# Patient Record
Sex: Female | Born: 1960
Health system: Southern US, Community
[De-identification: ages and names within clinical notes are randomized; demographics above are authoritative.]

## PROBLEM LIST (undated history)

## (undated) DIAGNOSIS — F419 Anxiety disorder, unspecified: Secondary | ICD-10-CM

## (undated) DIAGNOSIS — T8859XA Other complications of anesthesia, initial encounter: Secondary | ICD-10-CM

## (undated) DIAGNOSIS — M2400 Loose body in unspecified joint: Secondary | ICD-10-CM

## (undated) DIAGNOSIS — M1990 Unspecified osteoarthritis, unspecified site: Secondary | ICD-10-CM

## (undated) DIAGNOSIS — E785 Hyperlipidemia, unspecified: Secondary | ICD-10-CM

## (undated) DIAGNOSIS — N870 Mild cervical dysplasia: Secondary | ICD-10-CM

## (undated) DIAGNOSIS — T4145XA Adverse effect of unspecified anesthetic, initial encounter: Secondary | ICD-10-CM

## (undated) HISTORY — PX: AUGMENTATION MAMMAPLASTY: SUR837

## (undated) HISTORY — DX: Anxiety disorder, unspecified: F41.9

## (undated) HISTORY — DX: Mild cervical dysplasia: N87.0

## (undated) HISTORY — PX: LIPOSUCTION: SHX10

## (undated) HISTORY — DX: Hyperlipidemia, unspecified: E78.5

---

## 1985-12-13 DIAGNOSIS — N870 Mild cervical dysplasia: Secondary | ICD-10-CM

## 1985-12-13 HISTORY — DX: Mild cervical dysplasia: N87.0

## 1999-01-26 ENCOUNTER — Other Ambulatory Visit: Admission: RE | Admit: 1999-01-26 | Discharge: 1999-01-26 | Payer: Self-pay | Admitting: Obstetrics and Gynecology

## 1999-04-21 ENCOUNTER — Other Ambulatory Visit: Admission: RE | Admit: 1999-04-21 | Discharge: 1999-04-21 | Payer: Self-pay | Admitting: Obstetrics and Gynecology

## 2000-02-23 ENCOUNTER — Other Ambulatory Visit: Admission: RE | Admit: 2000-02-23 | Discharge: 2000-02-23 | Payer: Self-pay | Admitting: Obstetrics and Gynecology

## 2001-03-21 ENCOUNTER — Other Ambulatory Visit: Admission: RE | Admit: 2001-03-21 | Discharge: 2001-03-21 | Payer: Self-pay | Admitting: Obstetrics and Gynecology

## 2002-03-23 ENCOUNTER — Other Ambulatory Visit: Admission: RE | Admit: 2002-03-23 | Discharge: 2002-03-23 | Payer: Self-pay | Admitting: Obstetrics and Gynecology

## 2003-04-03 ENCOUNTER — Other Ambulatory Visit: Admission: RE | Admit: 2003-04-03 | Discharge: 2003-04-03 | Payer: Self-pay | Admitting: Obstetrics and Gynecology

## 2004-06-23 ENCOUNTER — Other Ambulatory Visit: Admission: RE | Admit: 2004-06-23 | Discharge: 2004-06-23 | Payer: Self-pay | Admitting: Obstetrics and Gynecology

## 2005-01-13 HISTORY — PX: EYE SURGERY: SHX253

## 2005-12-17 ENCOUNTER — Other Ambulatory Visit: Admission: RE | Admit: 2005-12-17 | Discharge: 2005-12-17 | Payer: Self-pay | Admitting: Obstetrics and Gynecology

## 2007-04-07 ENCOUNTER — Other Ambulatory Visit: Admission: RE | Admit: 2007-04-07 | Discharge: 2007-04-07 | Payer: Self-pay | Admitting: Obstetrics and Gynecology

## 2007-07-14 HISTORY — PX: OTHER SURGICAL HISTORY: SHX169

## 2007-07-25 ENCOUNTER — Ambulatory Visit (HOSPITAL_BASED_OUTPATIENT_CLINIC_OR_DEPARTMENT_OTHER): Admission: RE | Admit: 2007-07-25 | Discharge: 2007-07-25 | Payer: Self-pay | Admitting: Obstetrics and Gynecology

## 2008-04-19 ENCOUNTER — Other Ambulatory Visit: Admission: RE | Admit: 2008-04-19 | Discharge: 2008-04-19 | Payer: Self-pay | Admitting: Obstetrics and Gynecology

## 2009-12-13 HISTORY — PX: JOINT REPLACEMENT: SHX530

## 2011-04-27 NOTE — Op Note (Signed)
NAMEJOSY, Patricia [name]          ACCOUNT NO.:  [id number]   MEDICAL RECORD NO.:  [id number]          PATIENT TYPE:  AMB   LOCATION:  [facility]                         FACILITY:  [facility]   PHYSICIAN:  Cynthia P. Romine, M.D.DATE OF BIRTH:  [birth year]   DATE OF PROCEDURE:  07/25/2007  DATE OF DISCHARGE:                               OPERATIVE REPORT   PREOPERATIVE DIAGNOSES:  Menorrhagia.   POSTOPERATIVE DIAGNOSES:  Menorrhagia.   PROCEDURE:  Endometrial ablation, using the HydroThermAblation  technique.   SURGEON:  Cynthia P. Romine, M.D.   ANESTHESIA:  General by LMA.   ESTIMATED BLOOD LOSS:  Minimal.   COMPLICATIONS:  None.   DESCRIPTION OF PROCEDURE:  The patient was taken to the operating room  and after induction of general anesthesia, was placed in the dorsal  lithotomy position and prepped and draped in the usual fashion.  The  bladder was drained with a red rubber catheter.  Posterior weighted and  anterior Sims retractors were placed.  The cervix was grasped on its  anterior lip with a single-toothed tenaculum.  The uterus sounded to 8  cm.  The cervix was then dilated to a #25 Shawnie Pons.  The hysteroscope was  introduced.  Photographic documentation was taken of the cavity.  It was  a normal cavity.  The endometrium was nicely thinned from the Lupron the  patient had received preoperatively.  The tubal ostia were visualized.  The scope was withdrawn to just inside the endocervical canal.  Endometrial ablation was then carried out in the usual fashion,  according to the manufacturer's instructions.  Upon completion of the  procedure, photographic documentation was taken.  The scope was  withdrawn, the instruments removed from the vagina and the procedure was  terminated.  There were no complications.  Patient went in satisfactory  condition to postanesthesia recovery.      Cynthia P. Romine, M.D.  Electronically Signed     CPR/MEDQ  D:  07/25/2007  T:  07/26/2007   Job:  [id number]

## 2011-09-27 LAB — CBC
Hemoglobin: 13.9
MCHC: 34.4
MCV: 87.7
RDW: 13.2

## 2011-09-27 LAB — POCT HEMOGLOBIN-HEMACUE: Operator id: 114531

## 2013-06-26 ENCOUNTER — Encounter: Payer: Self-pay | Admitting: Obstetrics and Gynecology

## 2013-06-29 ENCOUNTER — Encounter: Payer: Self-pay | Admitting: Obstetrics and Gynecology

## 2013-06-29 ENCOUNTER — Ambulatory Visit (INDEPENDENT_AMBULATORY_CARE_PROVIDER_SITE_OTHER): Payer: BC Managed Care – PPO | Admitting: Obstetrics and Gynecology

## 2013-06-29 VITALS — BP 128/78 | HR 84 | Resp 18 | Ht 65.0 in | Wt 150.0 lb

## 2013-06-29 DIAGNOSIS — Z01419 Encounter for gynecological examination (general) (routine) without abnormal findings: Secondary | ICD-10-CM

## 2013-06-29 MED ORDER — PROGESTERONE MICRONIZED 100 MG PO CAPS: 100.0000 mg | ORAL_CAPSULE | Freq: Every day | ORAL | Status: DC

## 2013-06-29 MED ORDER — PAROXETINE HCL 10 MG PO TABS: 10.0000 mg | ORAL_TABLET | ORAL | Status: DC

## 2013-06-29 MED ORDER — ESTRADIOL 0.05 MG/24HR TD PTTW: 1.0000 | MEDICATED_PATCH | TRANSDERMAL | Status: DC

## 2013-06-29 NOTE — Progress Notes (Signed)
52 y.o.   Married    Caucasian   female   G2P2002   here for annual exam.  Saw me in Jan 2014 for [facility] sx's and started on Minivelle and Prometrium, called March 2014 stating she felt great, sleep better, fewer hot flashes.  Pt thought they caused wt gain so she stopped them for 4 weeks, and felt like she was going to "die"  More hot flashes, couldn't sleep so went back on. No vag bleeding.    No LMP recorded. Patient has had an ablation.          Sexually active: yes  The current method of family planning is post menopausal status.    Exercising: walking 4-5 days a week Last mammogram:  02/23/2013 benign Last pap smear:05/18/2010 neg History of abnormal pap:1987 CIN 1 Cryo  Smoking: no Alcohol: 7-10 glasses of wine a week Last colonoscopy:never pt declines Last Bone Density:  never Last tetanus shot:2013 Last cholesterol check: 01/2013 slightly elevated  Hgb:  pcp              Urine: pcp   Family History  Problem Relation Age of Onset  . Hypertension Mother 74  . Alzheimer's disease Mother   . Lymphoma Father   . Cancer Father     brain tumor    There are no active problems to display for this patient.   Past Medical History  Diagnosis Date  . Depression   . Anxiety   . Dysplasia of cervix, low grade (CIN 1) 1987    cryo    Past Surgical History  Procedure Laterality Date  . Liposuction    . Augmentation mammaplasty      2009 ruptured silicone implants, replaced with saline  . Joint replacement  12/2009    toe joint    Allergies: Review of patient's allergies indicates no known allergies.  Current Outpatient Prescriptions  Medication Sig Dispense Refill  . B Complex-C (B-COMPLEX WITH VITAMIN C) tablet Take 1 tablet by mouth daily.      . Calcium Carbonate-Vitamin D (CALCIUM + D PO) Take 1 tablet by mouth daily.      . cetirizine (ZYRTEC) 10 MG tablet Take 10 mg by mouth daily.      . fluticasone (FLONASE) 50 MCG/ACT nasal spray Place 2 sprays into the nose daily as  needed for rhinitis.      Marland Kitchen MELATONIN PO Take by mouth as needed.       Marland Kitchen MINIVELLE 0.05 MG/24HR 2 (two) times a week.       . Misc Natural Products (OSTEO BI-FLEX ADV DOUBLE ST PO) Take by mouth daily.      . Multiple Vitamin (MULTIVITAMIN) tablet Take 1 tablet by mouth daily.      Marland Kitchen PARoxetine (PAXIL) 10 MG tablet Take 10 mg by mouth every morning.      . progesterone (PROMETRIUM) 100 MG capsule daily.        No current facility-administered medications for this visit.    ROS: Pertinent items are noted in HPI.  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, theatre/television/film  Exam:    BP 128/78  Pulse 84  Resp 18  Ht 5\' 5"  (1.651 m)  Wt 150 lb (68.04 kg)  BMI 24.96 kg/m2  Ht stable, wt up 4 pounds since last year Wt Readings from Last 3 Encounters:  06/29/13 150 lb (68.04 kg)     Ht Readings from Last 3 Encounters:  06/29/13 5\' 5"  (1.651 m)  General appearance: alert, cooperative and appears stated age Head: Normocephalic, without obvious abnormality, atraumatic Neck: no adenopathy, supple, symmetrical, trachea midline and thyroid not enlarged, symmetric, no tenderness/mass/nodules Lungs: clear to auscultation bilaterally Breasts: Inspection negative, No nipple retraction or dimpling, No nipple discharge or bleeding, No axillary or supraclavicular adenopathy, Normal to palpation without dominant masses Heart: regular rate and rhythm Abdomen: soft, non-tender; bowel sounds normal; no masses,  no organomegaly Extremities: extremities normal, atraumatic, no cyanosis or edema Skin: Skin color, texture, turgor normal. No rashes or lesions Lymph nodes: Cervical, supraclavicular, and axillary nodes normal. No abnormal inguinal nodes palpated Neurologic: Grossly normal   Pelvic: External genitalia:  no lesions              Urethra:  normal appearing urethra with no masses, tenderness or lesions              Bartholins and Skenes: normal                 Vagina: normal appearing vagina with  normal color and discharge, no lesions              Cervix: normal appearance              Pap taken: yes        Bimanual Exam:  Uterus:  uterus is normal size, shape, consistency and nontender, mobile, AF                                      Adnexa: normal adnexa in size, nontender and no masses                                      Rectovaginal: Confirms                                      Anus:  normal sphincter tone, no lesions  A: normal menopausal exam, on HRT     S/p HTA     Anxiety, on tx     P: mammogram pap smear counseled on breast self exam, mammography screening, adequate intake of calcium and vitamin D, diet and exercise return annually or prn   RF Minivell, P4, and Paxil  An After Visit Summary was printed and given to the patient.

## 2013-06-29 NOTE — Patient Instructions (Addendum)

## 2013-07-03 LAB — IPS PAP TEST WITH HPV

## 2013-07-31 ENCOUNTER — Other Ambulatory Visit: Payer: Self-pay | Admitting: Obstetrics and Gynecology

## 2013-08-01 NOTE — Telephone Encounter (Signed)
06/29/13 #90/3rf's was sent through to pharmacy- Denied

## 2014-03-27 ENCOUNTER — Encounter: Payer: Self-pay | Admitting: Obstetrics and Gynecology

## 2014-06-26 ENCOUNTER — Other Ambulatory Visit: Payer: Self-pay | Admitting: Obstetrics and Gynecology

## 2014-06-26 MED ORDER — ESTRADIOL 0.05 MG/24HR TD PTTW: 1.0000 | MEDICATED_PATCH | TRANSDERMAL | Status: DC

## 2014-06-26 NOTE — Telephone Encounter (Signed)
estradiol (MINIVELLE) 0.05 MG/24HR  Place 1 patch (0.05 mg total) onto the skin 2 (two) times a week., Starting 06/29/2013, Until Discontinued, Normal, Last Dose: Not Recorded  Refills: 3 ordered [id number] - [place], [place] - [address] [facility] [address]  Patient asking for refill has aex scheduled for 07/26/14

## 2014-06-26 NOTE — Telephone Encounter (Addendum)
LM on patient's vm that rx has been sent. 

## 2014-06-26 NOTE — Telephone Encounter (Signed)
Last refilled/ AEX: 06/29/13 #24/3 Refills AEX Scheduled: 07/26/14 with Dr. Edward JollySilva Last Mammogram:  02/23/13 Bi-rads 1  Please Advise.  (Chart in your incoming basket)  (Routed to Dr. Hyacinth MeekerMiller given Dr. Edward JollySilva out of office this week)

## 2014-07-05 ENCOUNTER — Ambulatory Visit: Payer: BC Managed Care – PPO | Admitting: Obstetrics and Gynecology

## 2014-07-08 ENCOUNTER — Other Ambulatory Visit: Payer: Self-pay

## 2014-07-08 MED ORDER — PAROXETINE HCL 10 MG PO TABS: 10.0000 mg | ORAL_TABLET | ORAL | Status: DC

## 2014-07-08 NOTE — Telephone Encounter (Signed)
Last AEX: 06/29/13 Last refill:06/29/13 #90, 3rf Current AEX:07/26/14  Please advise

## 2014-07-17 ENCOUNTER — Ambulatory Visit: Payer: BC Managed Care – PPO | Admitting: Obstetrics and Gynecology

## 2014-07-26 ENCOUNTER — Ambulatory Visit (INDEPENDENT_AMBULATORY_CARE_PROVIDER_SITE_OTHER): Payer: BC Managed Care – PPO | Admitting: Obstetrics and Gynecology

## 2014-07-26 ENCOUNTER — Encounter: Payer: Self-pay | Admitting: Obstetrics and Gynecology

## 2014-07-26 VITALS — BP 122/84 | HR 60 | Resp 18 | Ht 65.0 in | Wt 160.8 lb

## 2014-07-26 DIAGNOSIS — Z1211 Encounter for screening for malignant neoplasm of colon: Secondary | ICD-10-CM

## 2014-07-26 DIAGNOSIS — Z0000 Encounter for general adult medical examination without abnormal findings: Secondary | ICD-10-CM

## 2014-07-26 DIAGNOSIS — Z01419 Encounter for gynecological examination (general) (routine) without abnormal findings: Secondary | ICD-10-CM

## 2014-07-26 MED ORDER — ESTRADIOL 0.05 MG/24HR TD PTTW: 1.0000 | MEDICATED_PATCH | TRANSDERMAL | Status: DC

## 2014-07-26 MED ORDER — PROGESTERONE MICRONIZED 100 MG PO CAPS: 100.0000 mg | ORAL_CAPSULE | Freq: Every day | ORAL | Status: DC

## 2014-07-26 MED ORDER — PAROXETINE HCL 10 MG PO TABS: 10.0000 mg | ORAL_TABLET | ORAL | Status: DC

## 2014-07-26 NOTE — Patient Instructions (Signed)

## 2014-07-26 NOTE — Progress Notes (Signed)
Patient ID: Patricia [name], female   DOB: 08-18-1961, 53 y.o.   MRN: [id number] GYNECOLOGY VISIT  PCP:   Juluis Rainier, MD  Referring provider:   HPI: 53 y.o.   Married  Caucasian  female   G2P2002 with No LMP recorded. Patient has had an ablation.   here for   AEX. Patient is on HRT.   Stopped in January or February and just restarted in July due to hot flashes and difficulty sleeping.  Feeling much better.   Has gained weight and not changed anything except menopause.  Knee issues so cannot do vigorous exercise.  Exercises 3 - 4 times a week.  Has seen nutrition.  Thyroid function normal.  On Paxil for anxiety.  Difficulty stopping.   2 children living in [place].   Hgb:   PCP Urine:  Neg  GYNECOLOGIC HISTORY: No LMP recorded. Patient has had an ablation. Sexually active:  yes Partner preference: female Contraception:  Ablation  Menopausal hormone therapy:  DES exposure:   no Blood transfusions:  no   Sexually transmitted diseases:  no GYN procedures and prior surgeries:  Breast Augmentation, Ablation Last mammogram:  02/23/13- BI-RADS 2. [facility].         Last pap and high risk HPV testing: 06-29-13 wnl:neg HR HPV   History of abnormal pap smear:  1987 CIN I with cryotherapy   OB History   Grav Para Term Preterm Abortions TAB SAB Ect Mult Living   2 2 2       2        LIFESTYLE: Exercise:    Walking 3x/week            OTHER HEALTH MAINTENANCE: Tetanus/TDap:  2013 HPV:                 n/a Influenza:         09/2013   Bone density:   never Colonoscopy:   Never.  Declines.  Cholesterol check:  05/2014 elevated--on meds  Family History  Problem Relation Age of Onset  . Hypertension Mother 29  . Alzheimer's disease Mother   . Lymphoma Father   . Cancer Father     brain tumor    There are no active problems to display for this patient.  Past Medical History  Diagnosis Date  . Depression   . Anxiety   . Dysplasia of cervix, low grade (CIN 1) 1987     cryo  . Hyperlipemia     Past Surgical History  Procedure Laterality Date  . Liposuction    . Augmentation mammaplasty      2009 ruptured silicone implants, replaced with saline  . Joint replacement  12/2009    toe joint    ALLERGIES: Review of patient's allergies indicates no known allergies.  Current Outpatient Prescriptions  Medication Sig Dispense Refill  . B Complex-C (B-COMPLEX WITH VITAMIN C) tablet Take 1 tablet by mouth daily.      . Calcium Carbonate-Vitamin D (CALCIUM + D PO) Take 1 tablet by mouth daily.      . cetirizine (ZYRTEC) 10 MG tablet Take 10 mg by mouth daily.      Marland Kitchen estradiol (MINIVELLE) 0.05 MG/24HR patch Place 1 patch (0.05 mg total) onto the skin 2 (two) times a week.  8 patch  1  . fluticasone (FLONASE) 50 MCG/ACT nasal spray Place 2 sprays into the nose daily as needed for rhinitis.      Marland Kitchen PARoxetine (PAXIL) 10 MG tablet Take 1 tablet (  10 mg total) by mouth every morning.  30 tablet  0  . progesterone (PROMETRIUM) 100 MG capsule Take 1 capsule (100 mg total) by mouth daily.  90 capsule  3  . simvastatin (ZOCOR) 40 MG tablet Take 40 mg by mouth daily.       No current facility-administered medications for this visit.     ROS:  Pertinent items are noted in HPI.  History   Social History  . Marital Status: Married    Spouse Name: N/A    Number of Children: N/A  . Years of Education: N/A   Occupational History  . Not on file.   Social History Main Topics  . Smoking status: Never Smoker   . Smokeless tobacco: Never Used  . Alcohol Use: 5.4 oz/week    9 Glasses of wine per week     Comment: 7-9 glasses of wine a week  . Drug Use: No  . Sexual Activity: Yes    Partners: Male    Birth Control/ Protection: Post-menopausal   Other Topics Concern  . Not on file   Social History Narrative  . No narrative on file    PHYSICAL EXAMINATION:    BP 122/84  Pulse 60  Resp 18  Ht 5\' 5"  (1.651 m)  Wt 160 lb 12.8 oz (72.938 kg)  BMI 26.76  kg/m2   Wt Readings from Last 3 Encounters:  07/26/14 160 lb 12.8 oz (72.938 kg)  06/29/13 150 lb (68.04 kg)     Ht Readings from Last 3 Encounters:  07/26/14 5\' 5"  (1.651 m)  06/29/13 5\' 5"  (1.651 m)    General appearance: alert, cooperative and appears stated age Head: Normocephalic, without obvious abnormality, atraumatic Neck: no adenopathy, supple, symmetrical, trachea midline and thyroid not enlarged, symmetric, no tenderness/mass/nodules Lungs: clear to auscultation bilaterally Breasts: bilateral implants, No nipple retraction or dimpling, No nipple discharge or bleeding, No axillary or supraclavicular adenopathy, Normal to palpation without dominant masses Heart: regular rate and rhythm Abdomen: soft, non-tender; no masses,  no organomegaly Extremities: extremities normal, atraumatic, no cyanosis or edema Skin: Skin color, texture, turgor normal. No rashes or lesions Lymph nodes: Cervical, supraclavicular, and axillary nodes normal. No abnormal inguinal nodes palpated Neurologic: Grossly normal  Pelvic: External genitalia:  no lesions              Urethra:  normal appearing urethra with no masses, tenderness or lesions              Bartholins and Skenes: normal                 Vagina: normal appearing vagina with normal color and discharge, no lesions              Cervix: normal appearance              Pap and high risk HPV testing done: No.        Bimanual Exam:  Uterus:  uterus is normal size, shape, consistency and nontender                                      Adnexa: normal adnexa in size, nontender and no masses                                      Rectovaginal:  Yes.                                        Confirms above.                                      Anus:  normal sphincter tone, no lesions  ASSESSMENT  Normal gynecologic exam. HRT patient.  Bilateral breast implants.   PLAN  Mammogram recommended yearly starting at age 88.  Patient will schedule at  [facility]. Pap smear and high risk HPV testing as above. Counseled on self breast exam, Calcium and vitamin D intake, exercise. See lab orders: Yes.   IFOB. Refill of Minivelle 0.05 mg and Prometrium for one month until gets mammogram done.  Return annually or prn   An After Visit Summary was printed and given to the patient.

## 2014-07-30 ENCOUNTER — Encounter: Payer: Self-pay | Admitting: Certified Nurse Midwife

## 2014-07-30 ENCOUNTER — Ambulatory Visit (INDEPENDENT_AMBULATORY_CARE_PROVIDER_SITE_OTHER): Payer: BC Managed Care – PPO | Admitting: Certified Nurse Midwife

## 2014-07-30 VITALS — BP 122/80 | HR 68 | Resp 16 | Ht 65.0 in | Wt 162.0 lb

## 2014-07-30 DIAGNOSIS — B373 Candidiasis of vulva and vagina: Secondary | ICD-10-CM

## 2014-07-30 DIAGNOSIS — B3731 Acute candidiasis of vulva and vagina: Secondary | ICD-10-CM

## 2014-07-30 MED ORDER — NYSTATIN-TRIAMCINOLONE 100000-0.1 UNIT/GM-% EX CREA: 1.0000 "application " | TOPICAL_CREAM | Freq: Two times a day (BID) | CUTANEOUS | Status: DC

## 2014-07-30 MED ORDER — FLUCONAZOLE 150 MG PO TABS: ORAL_TABLET | ORAL | Status: DC

## 2014-07-30 NOTE — Progress Notes (Signed)
53 y.o.married g2p2 here with complaint of vaginal symptoms of itching, burning, and increase discharge. Describes discharge as white thick, no odor.. Onset of symptoms 4 days ago. Denies new personal products or vaginal dryness. [place] with staying wet swim suit prolonged times. No STD concerns. Urinary symptoms none .   O:Healthy female WDWN Affect: normal, orientation x 3  Exam: Abdomen: soft. Lymph node: no enlargement or tenderness Pelvic exam: External genital: increase pink with exudate, no scaling  Wet prep taken BUS: negative Vagina: white thick cheese appearance discharge noted. Ph: 4.0   ,Wet prep taken Cervix: normal, non tender Uterus: normal, non tender Adnexa:normal, non tender, no masses or fullness noted   Wet Prep results:positive for yeast vaginal and on vulva   A: Yeast vaginitis and vulvitis Vaginal dryness   P:Discussed findings of yeast vaginitis and vulvutis and etiology. Discussed Aveeno or baking soda sitz bath for comfort. Avoid moist clothes or pads for extended period of time. If working out in gym clothes or swim suits for long periods of time change underwear or bottoms of swimsuit if possible. Olive Oil/Coconut oil use for skin protection prior to activity can be used to external skin. Rx: Diflucan see order Rx Mycolog cream see order  Rv prn

## 2014-07-30 NOTE — Patient Instructions (Signed)

## 2014-07-31 NOTE — Progress Notes (Signed)
Reviewed personally.  M. Suzanne Burhan Barham, MD.  

## 2014-08-07 LAB — FECAL OCCULT BLOOD, IMMUNOCHEMICAL: IMMUNOLOGICAL FECAL OCCULT BLOOD TEST: NEGATIVE

## 2014-08-07 NOTE — Addendum Note (Signed)
Addended by: Luisa Dago on: 08/07/2014 09:54 AM   Modules accepted: Orders

## 2014-08-08 ENCOUNTER — Encounter: Payer: Self-pay | Admitting: Obstetrics and Gynecology

## 2014-08-19 ENCOUNTER — Other Ambulatory Visit: Payer: Self-pay | Admitting: Obstetrics and Gynecology

## 2014-08-20 NOTE — Telephone Encounter (Signed)
Last AEX: 07/26/14 Last refill:  Vivelle-Dot-07/26/14 #8 patches X 0, Progesterone-07/26/14 #30 X 0 Current AEX:09/12/15 Last MMG: 08/02/14  Bi-Rads Benign  Please advise

## 2014-09-15 ENCOUNTER — Other Ambulatory Visit: Payer: Self-pay | Admitting: Obstetrics & Gynecology

## 2014-09-16 NOTE — Telephone Encounter (Signed)
Last AEX: 07/26/14 Last refill:08/21/14 #30 X 0 Current AEX:09/12/15 (Dr. Edward JollySilva) Last MMG: 08/02/14 Bi-Rads Benign  Please advise

## 2014-10-14 ENCOUNTER — Encounter: Payer: Self-pay | Admitting: Certified Nurse Midwife

## 2014-12-12 HISTORY — PX: KNEE SURGERY: SHX244

## 2014-12-18 ENCOUNTER — Telehealth: Payer: Self-pay | Admitting: Obstetrics and Gynecology

## 2014-12-18 MED ORDER — FLUCONAZOLE 150 MG PO TABS: 150.0000 mg | ORAL_TABLET | Freq: Once | ORAL | Status: DC

## 2014-12-18 NOTE — Telephone Encounter (Signed)
I will agree to treat the patient with Diflucan 150 mg. Dispense 2 tabs. No refills.  Please send in to her pharmacy.   Office visit if symptoms persist.

## 2014-12-18 NOTE — Telephone Encounter (Signed)
Patient calling requesting an RX to treat a "yeast infection." She recently had knee surgery and is not able to come in.   Pharmacy on file is correct.

## 2014-12-18 NOTE — Telephone Encounter (Signed)
Spoke with patient. Advised rx for Diflucan 150mg  #2 0RF [facility] Patient is agreeable and will call if symptoms persist.  Routing to provider for final review. Patient agreeable to disposition. Will close encounter

## 2014-12-18 NOTE — Telephone Encounter (Signed)
Spoke with patient. Patient states that she had knee surgery on 12/31. Is currently on antibiotics. Yesterday began to have vaginal itching, burning, and thick white discharge. "I think it is just from the antibiotics and I had to change soaps from the risk of infection after surgery. I am not able to get to the office so I was hoping she could call something in for me." Advised patient will need to speak with Dr.Silva and return call with further recommendations and instructions. Patient is currently using [facility] on file.

## 2015-09-12 ENCOUNTER — Ambulatory Visit: Payer: BC Managed Care – PPO | Admitting: Obstetrics and Gynecology

## 2015-09-26 ENCOUNTER — Encounter: Payer: Self-pay | Admitting: Certified Nurse Midwife

## 2015-09-26 ENCOUNTER — Ambulatory Visit (INDEPENDENT_AMBULATORY_CARE_PROVIDER_SITE_OTHER): Payer: BLUE CROSS/BLUE SHIELD | Admitting: Certified Nurse Midwife

## 2015-09-26 VITALS — BP 122/84 | HR 72 | Resp 20 | Ht 65.25 in | Wt 159.0 lb

## 2015-09-26 DIAGNOSIS — N952 Postmenopausal atrophic vaginitis: Secondary | ICD-10-CM

## 2015-09-26 DIAGNOSIS — Z01419 Encounter for gynecological examination (general) (routine) without abnormal findings: Secondary | ICD-10-CM | POA: Diagnosis not present

## 2015-09-26 DIAGNOSIS — Z124 Encounter for screening for malignant neoplasm of cervix: Secondary | ICD-10-CM | POA: Diagnosis not present

## 2015-09-26 NOTE — Patient Instructions (Signed)
EXERCISE AND DIET:  We recommended that you start or continue a regular exercise program for good health. Regular exercise means any activity that makes your heart beat faster and makes you sweat.  We recommend exercising at least 30 minutes per day at least 3 days a week, preferably 4 or 5.  We also recommend a diet low in fat and sugar.  Inactivity, poor dietary choices and obesity can cause diabetes, heart attack, stroke, and kidney damage, among others.    ALCOHOL AND SMOKING:  Women should limit their alcohol intake to no more than 7 drinks/beers/glasses of wine (combined, not each!) per week. Moderation of alcohol intake to this level decreases your risk of breast cancer and liver damage. And of course, no recreational drugs are part of a healthy lifestyle.  And absolutely no smoking or even second hand smoke. Most people know smoking can cause heart and lung diseases, but did you know it also contributes to weakening of your bones? Aging of your skin?  Yellowing of your teeth and nails?  CALCIUM AND VITAMIN D:  Adequate intake of calcium and Vitamin D are recommended.  The recommendations for exact amounts of these supplements seem to change often, but generally speaking 600 mg of calcium (either carbonate or citrate) and 800 units of Vitamin D per day seems prudent. Certain women may benefit from higher intake of Vitamin D.  If you are among these women, your doctor will have told you during your visit.    PAP SMEARS:  Pap smears, to check for cervical cancer or precancers,  have traditionally been done yearly, although recent scientific advances have shown that most women can have pap smears less often.  However, every woman still should have a physical exam from her gynecologist every year. It will include a breast check, inspection of the vulva and vagina to check for abnormal growths or skin changes, a visual exam of the cervix, and then an exam to evaluate the size and shape of the uterus and  ovaries.  And after 55 years of age, a rectal exam is indicated to check for rectal cancers. We will also provide age appropriate advice regarding health maintenance, like when you should have certain vaccines, screening for sexually transmitted diseases, bone density testing, colonoscopy, mammograms, etc.   MAMMOGRAMS:  All women over 40 years old should have a yearly mammogram. Many facilities now offer a "3D" mammogram, which may cost around $50 extra out of pocket. If possible,  we recommend you accept the option to have the 3D mammogram performed.  It both reduces the number of women who will be called back for extra views which then turn out to be normal, and it is better than the routine mammogram at detecting truly abnormal areas.    COLONOSCOPY:  Colonoscopy to screen for colon cancer is recommended for all women at age 50.  We know, you hate the idea of the prep.  We agree, BUT, having colon cancer and not knowing it is worse!!  Colon cancer so often starts as a polyp that can be seen and removed at colonscopy, which can quite literally save your life!  And if your first colonoscopy is normal and you have no family history of colon cancer, most women don't have to have it again for 10 years.  Once every ten years, you can do something that may end up saving your life, right?  We will be happy to help you get it scheduled when you are ready.    Be sure to check your insurance coverage so you understand how much it will cost.  It may be covered as a preventative service at no cost, but you should check your particular policy.     Atrophic Vaginitis Atrophic vaginitis is a condition in which the tissues that line the vagina become dry and thin. This condition is most common in women who have stopped having regular menstrual periods (menopause). This usually starts when a woman is 45-55 years old. Estrogen helps to keep the vagina moist. It stimulates the vagina to produce a clear fluid that lubricates  the vagina for sexual intercourse. This fluid also protects the vagina from infection. Lack of estrogen can cause the lining of the vagina to get thinner and dryer. The vagina may also shrink in size. It may become less elastic. Atrophic vaginitis tends to get worse over time as a woman's estrogen level drops. CAUSES This condition is caused by the normal drop in estrogen that happens around the time of menopause. RISK FACTORS Certain conditions or situations may lower a woman's estrogen level, which increases her risk of atrophic vaginitis. These include:  Taking medicine that blocks estrogen.  Having ovaries removed surgically.  Being treated for cancer with X-ray treatment (radiation) or medicines (chemotherapy).  Exercising very hard and often.  Having an eating disorder (anorexia).  Giving birth or breastfeeding.  Being over the age of 50.  Smoking. SYMPTOMS Symptoms of this condition include:  Pain, soreness, or bleeding during sexual intercourse (dyspareunia).  Vaginal burning, irritation, or itching.  Pain or bleeding during a vaginal examination using a speculum (pelvic exam).  Loss of interest in sexual activity.  Having burning pain when passing urine.  Vaginal discharge that is brown or yellow. In some cases, there are no symptoms. DIAGNOSIS This condition is diagnosed with a medical history and physical exam. This will include a pelvic exam that checks whether the inside of your vagina appears pale, thin, or dry. Rarely, you may also have other tests, including:  A urine test.  A test that checks the acid balance in your vaginal fluid (acid balance test). TREATMENT Treatment for this condition may depend on the severity of your symptoms. Treatment may include:  Using an over-the-counter vaginal lubricant before you have sexual intercourse.  Using a long-acting vaginal moisturizer.  Using low-dose vaginal estrogen for moderate to severe symptoms that do  not respond to other treatments. Options include creams, tablets, and inserts (vaginal rings). Before using vaginal estrogen, tell your health care provider if you have a history of:  Breast cancer.  Endometrial cancer.  Blood clots.  Taking medicines. You may be able to take a daily pill for dyspareunia. Discuss all of the risks of this medicine with your health care provider. It is usually not recommended for women who have a family history or personal history of breast cancer. If your symptoms are very mild and you are not sexually active, you may not need treatment. HOME CARE INSTRUCTIONS  Take medicines only as directed by your health care provider. Do not use herbal or alternative medicines unless your health care provider says that you can.  Use over-the-counter creams, lubricants, or moisturizers for dryness only as directed by your health care provider.  If your atrophic vaginitis is caused by menopause, discuss all of your menopausal symptoms and treatment options with your health care provider.  Do not douche.  Do not use products that can make your vagina dry. These include:  Scented feminine sprays.    Scented tampons.  Scented soaps.  If it hurts to have sex, talk with your sexual partner. SEEK MEDICAL CARE IF:  Your discharge looks different than normal.  Your vagina has an unusual smell.  You have new symptoms.  Your symptoms do not improve with treatment.  Your symptoms get worse.   This information is not intended to replace advice given to you by your health care provider. Make sure you discuss any questions you have with your health care provider.   Document Released: 04/15/2015 Document Reviewed: 04/15/2015 Elsevier Interactive Patient Education 2016 [employer]  

## 2015-09-26 NOTE — Progress Notes (Signed)
54 y.o. [id number] Married  Caucasian Fe here for annual exam.  Menopausal no HRT, denies vaginal bleeding. Occasional vaginal dryness.  No more problems with vaginal yeast infections now. Aware of flu vaccine, does not take. Sees PCP for aex and labs and medication management for anxiety and depression. Last cholesterol still high per PCP, working with diet and exercise.  Busy with work.  No other health issues today.   Patient's last menstrual period was 12/13/2006.          Sexually active: Yes.    The current method of family planning is post menopausal status.    Exercising: Yes.    yoga,sit ups Smoker:  no  Health Maintenance: Pap: 06-29-13 neg HPV HR neg MMG: 2016 neg per patient Colonoscopy:  none BMD:   none TDaP:  2013 Labs: none Self breast exam: done monthly   reports that she has never smoked. She has never used smokeless tobacco. She reports that she drinks about 1.2 - 2.4 oz of alcohol per week. She reports that she does not use illicit drugs.  Past Medical History  Diagnosis Date  . Depression   . Anxiety   . Dysplasia of cervix, low grade (CIN 1) 1987    cryo  . Hyperlipemia     Past Surgical History  Procedure Laterality Date  . Liposuction    . Augmentation mammaplasty      2009 ruptured silicone implants, replaced with saline  . Joint replacement  12/2009    toe joint    Current Outpatient Prescriptions  Medication Sig Dispense Refill  . B Complex-C (B-COMPLEX WITH VITAMIN C) tablet Take 1 tablet by mouth daily.    . Calcium Carbonate-Vitamin D (CALCIUM + D PO) Take 1 tablet by mouth daily.    . cetirizine (ZYRTEC) 10 MG tablet Take 10 mg by mouth daily.    . Cholecalciferol (VITAMIN D PO) Take 1,000 Int'l Units by mouth daily.    . Glucosamine HCl (GLUCOSAMINE PO) Take by mouth daily.    . Naproxen (NAPROSYN PO) Take by mouth daily.    Marland Kitchen. PARoxetine (PAXIL) 10 MG tablet Take 1 tablet (10 mg total) by mouth every morning. 30 tablet 11   No current  facility-administered medications for this visit.    Family History  Problem Relation Age of Onset  . Hypertension Mother [id number]  . Alzheimer's disease Mother   . Lymphoma Father   . Cancer Father     brain tumor    ROS:  Pertinent items are noted in HPI.  Otherwise, a comprehensive ROS was negative.  Exam:   BP 122/84 mmHg  Pulse 72  Resp 20  Ht 5' 5.25" (1.657 m)  Wt 159 lb (72.122 kg)  BMI 26.27 kg/m2  LMP 12/13/2006 Height: 5' 5.25" (165.7 cm) Ht Readings from Last 3 Encounters:  09/26/15 5' 5.25" (1.657 m)  07/30/14 5\' 5"  (1.651 m)  07/26/14 5\' 5"  (1.651 m)    General appearance: alert, cooperative and appears stated age Head: Normocephalic, without obvious abnormality, atraumatic Neck: no adenopathy, supple, symmetrical, trachea midline and thyroid normal to inspection and palpation Lungs: clear to auscultation bilaterally Breasts: normal appearance, no masses or tenderness, No nipple retraction or dimpling, No nipple discharge or bleeding, No axillary or supraclavicular adenopathy, saline implants feel intact Heart: regular rate and rhythm Abdomen: soft, non-tender; no masses,  no organomegaly Extremities: extremities normal, atraumatic, no cyanosis or edema Skin: Skin color, texture, turgor normal. No rashes or lesions Lymph nodes: Cervical,  supraclavicular, and axillary nodes normal. No abnormal inguinal nodes palpated Neurologic: Grossly normal   Pelvic: External genitalia:  no lesions              Urethra:  normal appearing urethra with no masses, tenderness or lesions              Bartholin's and Skene's: normal                 Vagina:  Atrophic appearing vagina with normal color and some moisture noted, no lesions              Cervix: normal,non tender, no lesions              Pap taken: Yes.   Bimanual Exam:  Uterus:  normal size, contour, position, consistency, mobility, non-tender and mid position              Adnexa: normal adnexa and no mass, fullness,  tenderness               Rectovaginal: Confirms               Anus:  normal sphincter tone, no lesions  Chaperone present: yes   A:  Well Woman with normal exam  Menopausal no HRT  Atrophic vaginitis  Anxiety/depression/cholesteriol management with PCP  P:   Reviewed health and wellness pertinent to exam  Aware of need to evaluate if vaginal bleeding  Discussed finding of vaginal changes related to dryness. Aware of Estrogen option, but prefers none. Discussed pure coconut oil use for dryness and sexually activity. Instructions given for daily use and for sexual activity. Encouraged to decrease wiping with voiding and pat instead to decrease irritation. Patient will advise if problems or no change.  Continue follow up with PCP as indicated.  Pap smear as above taken with HPV reflex   counseled on breast self exam, mammography screening, adequate intake of calcium and vitamin D, diet and exercise, Kegel's exercises  return annually or prn  An After Visit Summary was printed and given to the patient.

## 2015-09-30 LAB — IPS PAP TEST WITH REFLEX TO HPV

## 2015-10-01 NOTE — Progress Notes (Signed)
Encounter reviewed Jill Jertson, MD   

## 2016-01-20 ENCOUNTER — Other Ambulatory Visit (HOSPITAL_COMMUNITY): Payer: Self-pay | Admitting: Orthopaedic Surgery

## 2016-01-20 ENCOUNTER — Other Ambulatory Visit (HOSPITAL_COMMUNITY): Payer: Self-pay | Admitting: *Deleted

## 2016-01-20 NOTE — Patient Instructions (Signed)
Patricia [name]  01/20/2016   Your procedure is scheduled on: 01-30-16  Report to [facility] Main  Entrance take [facility]  elevators to 3rd floor to  Short Stay Center at 530  AM.  Call this number if you have problems the morning of surgery [phone]   Remember: ONLY 1 PERSON MAY GO WITH YOU TO SHORT STAY TO GET  READY MORNING OF YOUR SURGERY.  Do not eat food or drink liquids :After Midnight.     Take these medicines the morning of surgery with A SIP OF WATER: EYE DROP, CERTRIZINE (ZYRTEC), PAROXETINE (PAXIL)              You may not have any metal on your body including hair pins and              piercings  Do not wear jewelry, make-up, lotions, powders or perfumes, deodorant             Do not wear nail polish.  Do not shave  48 hours prior to surgery.              Men may shave face and neck.   Do not bring valuables to the hospital. [facility] IS NOT             RESPONSIBLE   FOR VALUABLES.  Contacts, dentures or bridgework may not be worn into surgery.  Leave suitcase in the car. After surgery it may be brought to your room.     Patients discharged the day of surgery will not be allowed to drive home.  Name and phone number of your driver:  Special Instructions: N/A              Please read over the following fact sheets you were given: _____________________________________________________________________             [facility] - Preparing for Surgery Before surgery, you can play an important role.  Because skin is not sterile, your skin needs to be as free of germs as possible.  You can reduce the number of germs on your skin by washing with CHG (chlorahexidine gluconate) soap before surgery.  CHG is an antiseptic cleaner which kills germs and bonds with the skin to continue killing germs even after washing. Please DO NOT use if you have an allergy to CHG or antibacterial soaps.  If your skin becomes reddened/irritated stop using the CHG and  inform your nurse when you arrive at Short Stay. Do not shave (including legs and underarms) for at least 48 hours prior to the first CHG shower.  You may shave your face/neck. Please follow these instructions carefully:  1.  Shower with CHG Soap the night before surgery and the  morning of Surgery.  2.  If you choose to wash your hair, wash your hair first as usual with your  normal  shampoo.  3.  After you shampoo, rinse your hair and body thoroughly to remove the  shampoo.                           4.  Use CHG as you would any other liquid soap.  You can apply chg directly  to the skin and wash                       Gently  with a scrungie or clean washcloth.  5.  Apply the CHG Soap to your body ONLY FROM THE NECK DOWN.   Do not use on face/ open                           Wound or open sores. Avoid contact with eyes, ears mouth and genitals (private parts).                       Wash face,  Genitals (private parts) with your normal soap.             6.  Wash thoroughly, paying special attention to the area where your surgery  will be performed.  7.  Thoroughly rinse your body with warm water from the neck down.  8.  DO NOT shower/wash with your normal soap after using and rinsing off  the CHG Soap.                9.  Pat yourself dry with a clean towel.            10.  Wear clean pajamas.            11.  Place clean sheets on your bed the night of your first shower and do not  sleep with pets. Day of Surgery : Do not apply any lotions/deodorants the morning of surgery.  Please wear clean clothes to the hospital/surgery center.  FAILURE TO FOLLOW THESE INSTRUCTIONS MAY RESULT IN THE CANCELLATION OF YOUR SURGERY PATIENT SIGNATURE_________________________________  NURSE SIGNATURE__________________________________  ________________________________________________________________________

## 2016-01-23 ENCOUNTER — Encounter (HOSPITAL_COMMUNITY)
Admission: RE | Admit: 2016-01-23 | Discharge: 2016-01-23 | Disposition: A | Discharge status: Home / Self Care | Payer: BLUE CROSS/BLUE SHIELD | Source: Ambulatory Visit | Attending: Orthopaedic Surgery | Admitting: Orthopaedic Surgery

## 2016-01-23 ENCOUNTER — Encounter (HOSPITAL_COMMUNITY): Payer: Self-pay

## 2016-01-23 DIAGNOSIS — Z01812 Encounter for preprocedural laboratory examination: Secondary | ICD-10-CM | POA: Insufficient documentation

## 2016-01-23 DIAGNOSIS — Z0183 Encounter for blood typing: Secondary | ICD-10-CM | POA: Diagnosis not present

## 2016-01-23 DIAGNOSIS — M1712 Unilateral primary osteoarthritis, left knee: Secondary | ICD-10-CM | POA: Diagnosis not present

## 2016-01-23 HISTORY — DX: Unspecified osteoarthritis, unspecified site: M19.90

## 2016-01-23 HISTORY — DX: Adverse effect of unspecified anesthetic, initial encounter: T41.45XA

## 2016-01-23 HISTORY — DX: Loose body in unspecified joint: M24.00

## 2016-01-23 HISTORY — DX: Other complications of anesthesia, initial encounter: T88.59XA

## 2016-01-23 LAB — BASIC METABOLIC PANEL
Anion gap: 11 (ref 5–15)
BUN: 30 mg/dL — ABNORMAL HIGH (ref 6–20)
CO2: 27 mmol/L (ref 22–32)
Calcium: 10.2 mg/dL (ref 8.9–10.3)
Chloride: 105 mmol/L (ref 101–111)
Creatinine, Ser: 0.73 mg/dL (ref 0.44–1.00)
GFR calc Af Amer: >60 mL/min (ref >60)
GFR calc non Af Amer: >60 mL/min (ref >60)
Glucose, Bld: 95 mg/dL (ref 65–99)
Potassium: 4.6 mmol/L (ref 3.5–5.1)
Sodium: 143 mmol/L (ref 135–145)

## 2016-01-23 LAB — HCG, SERUM, QUALITATIVE: Preg, Serum: NEGATIVE

## 2016-01-23 LAB — CBC
HCT: 42.5 % (ref 36.0–46.0)
Hemoglobin: 14.5 g/dL (ref 12.0–15.0)
MCH: 30.3 pg (ref 26.0–34.0)
MCHC: 34.1 g/dL (ref 30.0–36.0)
MCV: 88.7 fL (ref 78.0–100.0)
PLATELETS: 226 10*3/uL (ref 150–400)
RBC: 4.79 MIL/uL (ref 3.87–5.11)
RDW: 12.4 % (ref 11.5–15.5)
WBC: 9.6 10*3/uL (ref 4.0–10.5)

## 2016-01-23 LAB — SURGICAL PCR SCREEN
MRSA, PCR: NEGATIVE
Staphylococcus aureus: NEGATIVE

## 2016-01-23 NOTE — Progress Notes (Signed)
bmet results faxed by epic to dr Magnus Ivan

## 2016-01-24 LAB — ABO/RH: ABO/RH(D): B POS

## 2016-01-29 NOTE — Anesthesia Preprocedure Evaluation (Addendum)
Anesthesia Evaluation  Patient identified by MRN, date of birth, ID band Patient awake    Reviewed: Allergy & Precautions, H&P , Patient's Chart, lab work & pertinent test results, reviewed documented beta blocker date and time   Airway Mallampati: II  TM Distance: >3 FB Neck ROM: full    Dental no notable dental hx.    Pulmonary    Pulmonary exam normal breath sounds clear to auscultation       Cardiovascular  Rhythm:regular Rate:Normal     Neuro/Psych    GI/Hepatic   Endo/Other    Renal/GU      Musculoskeletal   Abdominal   Peds  Hematology   Anesthesia Other Findings   Reproductive/Obstetrics                             Anesthesia Physical Anesthesia Plan  ASA: II  Anesthesia Plan: General   Post-op Pain Management:    Induction: Intravenous  Airway Management Planned: Oral ETT  Additional Equipment:   Intra-op Plan:   Post-operative Plan: Extubation in OR  Informed Consent: I have reviewed the patients History and Physical, chart, labs and discussed the procedure including the risks, benefits and alternatives for the proposed anesthesia with the patient or authorized representative who has indicated his/her understanding and acceptance.   Dental Advisory Given and Dental advisory given  Plan Discussed with: CRNA and Surgeon  Anesthesia Plan Comments: (  Discussed general anesthesia, including possible nausea, instrumentation of airway, sore throat,pulmonary aspiration, etc. I asked if the were any outstanding questions, or  concerns before we proceeded.)        Anesthesia Quick Evaluation  

## 2016-01-30 ENCOUNTER — Inpatient Hospital Stay (HOSPITAL_COMMUNITY): Payer: BLUE CROSS/BLUE SHIELD | Admitting: Anesthesiology

## 2016-01-30 ENCOUNTER — Encounter (HOSPITAL_COMMUNITY): Payer: Self-pay | Admitting: *Deleted

## 2016-01-30 ENCOUNTER — Encounter (HOSPITAL_COMMUNITY)
Admission: RE | Disposition: A | Discharge status: Home Health | Payer: Self-pay | Source: Ambulatory Visit | Attending: Orthopaedic Surgery

## 2016-01-30 ENCOUNTER — Inpatient Hospital Stay (HOSPITAL_COMMUNITY)
Admission: RE | Admit: 2016-01-30 | Discharge: 2016-02-01 | DRG: 470 | Disposition: A | Discharge status: Home Health | Payer: BLUE CROSS/BLUE SHIELD | Source: Ambulatory Visit | Attending: Orthopaedic Surgery | Admitting: Orthopaedic Surgery

## 2016-01-30 ENCOUNTER — Inpatient Hospital Stay (HOSPITAL_COMMUNITY): Payer: BLUE CROSS/BLUE SHIELD

## 2016-01-30 DIAGNOSIS — M25562 Pain in left knee: Secondary | ICD-10-CM | POA: Diagnosis present

## 2016-01-30 DIAGNOSIS — Z01812 Encounter for preprocedural laboratory examination: Secondary | ICD-10-CM | POA: Diagnosis not present

## 2016-01-30 DIAGNOSIS — M1712 Unilateral primary osteoarthritis, left knee: Secondary | ICD-10-CM | POA: Diagnosis present

## 2016-01-30 DIAGNOSIS — Z96652 Presence of left artificial knee joint: Secondary | ICD-10-CM

## 2016-01-30 HISTORY — PX: TOTAL KNEE ARTHROPLASTY: SHX125

## 2016-01-30 LAB — TYPE AND SCREEN
ABO/RH(D): B POS
Antibody Screen: NEGATIVE

## 2016-01-30 SURGERY — ARTHROPLASTY, KNEE, TOTAL
Anesthesia: General | Laterality: Left

## 2016-01-30 MED ORDER — SUCCINYLCHOLINE CHLORIDE 20 MG/ML IJ SOLN
INTRAMUSCULAR | Status: DC | PRN
  Administered 2016-01-30: 100 mg via INTRAVENOUS

## 2016-01-30 MED ORDER — HYDROMORPHONE HCL 1 MG/ML IJ SOLN
1.0000 mg | INTRAMUSCULAR | Status: DC | PRN
  Administered 2016-01-30 – 2016-01-31 (×9): 1 mg via INTRAVENOUS
  Filled 2016-01-30 (×9): qty 1

## 2016-01-30 MED ORDER — HYDROMORPHONE HCL 1 MG/ML IJ SOLN
INTRAMUSCULAR | Status: AC
  Filled 2016-01-30: qty 1

## 2016-01-30 MED ORDER — SODIUM CHLORIDE 0.9 % IJ SOLN
INTRAMUSCULAR | Status: AC
  Filled 2016-01-30: qty 20

## 2016-01-30 MED ORDER — PROPOFOL 10 MG/ML IV BOLUS
INTRAVENOUS | Status: AC
  Filled 2016-01-30: qty 20

## 2016-01-30 MED ORDER — EPHEDRINE SULFATE 50 MG/ML IJ SOLN
INTRAMUSCULAR | Status: DC | PRN
  Administered 2016-01-30: 5 mg via INTRAVENOUS

## 2016-01-30 MED ORDER — FENTANYL CITRATE (PF) 100 MCG/2ML IJ SOLN
INTRAMUSCULAR | Status: DC | PRN
Start: 2016-01-30 — End: 2016-01-30
  Administered 2016-01-30: 50 ug via INTRAVENOUS
  Administered 2016-01-30: 100 ug via INTRAVENOUS
  Administered 2016-01-30: 50 ug via INTRAVENOUS
  Administered 2016-01-30 (×2): 25 ug via INTRAVENOUS

## 2016-01-30 MED ORDER — HYDROMORPHONE HCL 2 MG/ML IJ SOLN
INTRAMUSCULAR | Status: AC
  Filled 2016-01-30: qty 1

## 2016-01-30 MED ORDER — CEFAZOLIN SODIUM-DEXTROSE 2-3 GM-% IV SOLR
INTRAVENOUS | Status: AC
  Filled 2016-01-30: qty 50

## 2016-01-30 MED ORDER — ACETAMINOPHEN 325 MG PO TABS
650.0000 mg | ORAL_TABLET | Freq: Four times a day (QID) | ORAL | Status: DC | PRN
  Administered 2016-01-31 – 2016-02-01 (×2): 650 mg via ORAL
  Filled 2016-01-30 (×2): qty 2

## 2016-01-30 MED ORDER — SODIUM CHLORIDE 0.9 % IR SOLN
Status: DC | PRN
  Administered 2016-01-30: 2000 mL

## 2016-01-30 MED ORDER — SODIUM CHLORIDE 0.9 % IJ SOLN
INTRAMUSCULAR | Status: AC
  Filled 2016-01-30: qty 50

## 2016-01-30 MED ORDER — SODIUM CHLORIDE 0.9 % IR SOLN
Status: DC | PRN
  Administered 2016-01-30: 1000 mL

## 2016-01-30 MED ORDER — PHENOL 1.4 % MT LIQD: 1.0000 | OROMUCOSAL | Status: DC | PRN

## 2016-01-30 MED ORDER — MENTHOL 3 MG MT LOZG: 1.0000 | LOZENGE | OROMUCOSAL | Status: DC | PRN

## 2016-01-30 MED ORDER — ONDANSETRON HCL 4 MG/2ML IJ SOLN: 4.0000 mg | Freq: Four times a day (QID) | INTRAMUSCULAR | Status: DC | PRN

## 2016-01-30 MED ORDER — DEXAMETHASONE SODIUM PHOSPHATE 10 MG/ML IJ SOLN
INTRAMUSCULAR | Status: AC
  Filled 2016-01-30: qty 1

## 2016-01-30 MED ORDER — METOCLOPRAMIDE HCL 5 MG/ML IJ SOLN: 5.0000 mg | Freq: Three times a day (TID) | INTRAMUSCULAR | Status: DC | PRN

## 2016-01-30 MED ORDER — CEFAZOLIN SODIUM 1-5 GM-% IV SOLN
1.0000 g | Freq: Four times a day (QID) | INTRAVENOUS | Status: AC
  Administered 2016-01-30 (×2): 1 g via INTRAVENOUS
  Filled 2016-01-30 (×2): qty 50

## 2016-01-30 MED ORDER — LIDOCAINE HCL (CARDIAC) 20 MG/ML IV SOLN
INTRAVENOUS | Status: DC | PRN
  Administered 2016-01-30: 50 mg via INTRAVENOUS

## 2016-01-30 MED ORDER — MIDAZOLAM HCL 5 MG/5ML IJ SOLN
INTRAMUSCULAR | Status: DC | PRN
  Administered 2016-01-30 (×2): 1 mg via INTRAVENOUS

## 2016-01-30 MED ORDER — PAROXETINE HCL 10 MG PO TABS
10.0000 mg | ORAL_TABLET | Freq: Every day | ORAL | Status: DC
  Administered 2016-01-31 – 2016-02-01 (×2): 10 mg via ORAL
  Filled 2016-01-30 (×2): qty 1

## 2016-01-30 MED ORDER — KETOROLAC TROMETHAMINE 15 MG/ML IJ SOLN
INTRAMUSCULAR | Status: AC
  Filled 2016-01-30: qty 1

## 2016-01-30 MED ORDER — ONDANSETRON HCL 4 MG PO TABS: 4.0000 mg | ORAL_TABLET | Freq: Four times a day (QID) | ORAL | Status: DC | PRN

## 2016-01-30 MED ORDER — METHOCARBAMOL 500 MG PO TABS
500.0000 mg | ORAL_TABLET | Freq: Four times a day (QID) | ORAL | Status: DC | PRN
  Administered 2016-01-30 – 2016-02-01 (×6): 500 mg via ORAL
  Filled 2016-01-30 (×8): qty 1

## 2016-01-30 MED ORDER — KETOROLAC TROMETHAMINE 15 MG/ML IJ SOLN
7.5000 mg | Freq: Four times a day (QID) | INTRAMUSCULAR | Status: AC
  Administered 2016-01-30 – 2016-01-31 (×4): 7.5 mg via INTRAVENOUS
  Filled 2016-01-30 (×3): qty 1

## 2016-01-30 MED ORDER — CEFAZOLIN SODIUM-DEXTROSE 2-3 GM-% IV SOLR
2.0000 g | INTRAVENOUS | Status: AC
  Administered 2016-01-30: 2 g via INTRAVENOUS

## 2016-01-30 MED ORDER — ONDANSETRON HCL 4 MG/2ML IJ SOLN
INTRAMUSCULAR | Status: DC | PRN
  Administered 2016-01-30: 4 mg via INTRAVENOUS

## 2016-01-30 MED ORDER — ONDANSETRON HCL 4 MG/2ML IJ SOLN
INTRAMUSCULAR | Status: AC
  Filled 2016-01-30: qty 2

## 2016-01-30 MED ORDER — HYDROMORPHONE HCL 1 MG/ML IJ SOLN
INTRAMUSCULAR | Status: DC | PRN
  Administered 2016-01-30: 0.5 mg via INTRAVENOUS
  Administered 2016-01-30 (×2): .2 mg via INTRAVENOUS
  Administered 2016-01-30: .4 mg via INTRAVENOUS
  Administered 2016-01-30: .5 mg via INTRAVENOUS
  Administered 2016-01-30: .2 mg via INTRAVENOUS

## 2016-01-30 MED ORDER — EPHEDRINE SULFATE 50 MG/ML IJ SOLN
INTRAMUSCULAR | Status: AC
  Filled 2016-01-30: qty 1

## 2016-01-30 MED ORDER — TRANEXAMIC ACID 1000 MG/10ML IV SOLN
1000.0000 mg | INTRAVENOUS | Status: AC
  Administered 2016-01-30: 1000 mg via INTRAVENOUS
  Filled 2016-01-30: qty 10

## 2016-01-30 MED ORDER — DEXAMETHASONE SODIUM PHOSPHATE 4 MG/ML IJ SOLN
INTRAMUSCULAR | Status: DC | PRN
  Administered 2016-01-30: 10 mg via INTRAVENOUS

## 2016-01-30 MED ORDER — SODIUM CHLORIDE 0.9 % IJ SOLN
INTRAMUSCULAR | Status: DC | PRN
  Administered 2016-01-30: 40 mL

## 2016-01-30 MED ORDER — BUPIVACAINE LIPOSOME 1.3 % IJ SUSP
20.0000 mL | Freq: Once | INTRAMUSCULAR | Status: AC
  Administered 2016-01-30: 20 mL
  Filled 2016-01-30: qty 20

## 2016-01-30 MED ORDER — METOCLOPRAMIDE HCL 10 MG PO TABS: 5.0000 mg | ORAL_TABLET | Freq: Three times a day (TID) | ORAL | Status: DC | PRN

## 2016-01-30 MED ORDER — OXYCODONE HCL 5 MG PO TABS
5.0000 mg | ORAL_TABLET | ORAL | Status: DC | PRN
  Administered 2016-01-30 – 2016-02-01 (×11): 10 mg via ORAL
  Filled 2016-01-30 (×11): qty 2

## 2016-01-30 MED ORDER — HYDROMORPHONE HCL 1 MG/ML IJ SOLN
0.2500 mg | INTRAMUSCULAR | Status: DC | PRN
  Administered 2016-01-30 (×2): 0.5 mg via INTRAVENOUS
  Administered 2016-01-30: 0.25 mg via INTRAVENOUS

## 2016-01-30 MED ORDER — LIDOCAINE HCL (CARDIAC) 20 MG/ML IV SOLN
INTRAVENOUS | Status: AC
  Filled 2016-01-30: qty 5

## 2016-01-30 MED ORDER — STERILE WATER FOR IRRIGATION IR SOLN
Status: DC | PRN
  Administered 2016-01-30: 2000 mL

## 2016-01-30 MED ORDER — MIDAZOLAM HCL 2 MG/2ML IJ SOLN
INTRAMUSCULAR | Status: AC
  Filled 2016-01-30: qty 2

## 2016-01-30 MED ORDER — RIVAROXABAN 10 MG PO TABS
10.0000 mg | ORAL_TABLET | Freq: Every day | ORAL | Status: DC
  Administered 2016-01-31 – 2016-02-01 (×2): 10 mg via ORAL
  Filled 2016-01-30 (×3): qty 1

## 2016-01-30 MED ORDER — DOCUSATE SODIUM 100 MG PO CAPS: 100.0000 mg | ORAL_CAPSULE | Freq: Two times a day (BID) | ORAL | Status: DC

## 2016-01-30 MED ORDER — ZOLPIDEM TARTRATE 5 MG PO TABS: 5.0000 mg | ORAL_TABLET | Freq: Every evening | ORAL | Status: DC | PRN

## 2016-01-30 MED ORDER — METHOCARBAMOL 1000 MG/10ML IJ SOLN
500.0000 mg | Freq: Four times a day (QID) | INTRAVENOUS | Status: DC | PRN
  Administered 2016-01-30: 500 mg via INTRAVENOUS
  Filled 2016-01-30 (×2): qty 5

## 2016-01-30 MED ORDER — LACTATED RINGERS IV SOLN
INTRAVENOUS | Status: DC | PRN
  Administered 2016-01-30 (×3): via INTRAVENOUS

## 2016-01-30 MED ORDER — FENTANYL CITRATE (PF) 250 MCG/5ML IJ SOLN
INTRAMUSCULAR | Status: AC
  Filled 2016-01-30: qty 5

## 2016-01-30 MED ORDER — PROPOFOL 10 MG/ML IV BOLUS
INTRAVENOUS | Status: DC | PRN
  Administered 2016-01-30: 120 mg via INTRAVENOUS
  Administered 2016-01-30: 30 mg via INTRAVENOUS

## 2016-01-30 MED ORDER — DIPHENHYDRAMINE HCL 12.5 MG/5ML PO ELIX: 12.5000 mg | ORAL_SOLUTION | ORAL | Status: DC | PRN

## 2016-01-30 MED ORDER — ALUM & MAG HYDROXIDE-SIMETH 200-200-20 MG/5ML PO SUSP: 30.0000 mL | ORAL | Status: DC | PRN

## 2016-01-30 MED ORDER — DOCUSATE SODIUM 100 MG PO CAPS
100.0000 mg | ORAL_CAPSULE | Freq: Two times a day (BID) | ORAL | Status: DC
  Administered 2016-01-30 – 2016-02-01 (×4): 100 mg via ORAL

## 2016-01-30 MED ORDER — ACETAMINOPHEN 650 MG RE SUPP: 650.0000 mg | Freq: Four times a day (QID) | RECTAL | Status: DC | PRN

## 2016-01-30 SURGICAL SUPPLY — 59 items
APL SKNCLS STERI-STRIP NONHPOA (GAUZE/BANDAGES/DRESSINGS) ×1
BAG SPEC THK2 15X12 ZIP CLS (MISCELLANEOUS)
BAG ZIPLOCK 12X15 (MISCELLANEOUS) IMPLANT
BANDAGE ACE 6X5 VEL STRL LF (GAUZE/BANDAGES/DRESSINGS) ×2 IMPLANT
BENZOIN TINCTURE PRP APPL 2/3 (GAUZE/BANDAGES/DRESSINGS) ×1 IMPLANT
BLADE SAG 13.0X1.37X90 (BLADE) IMPLANT
BLADE SAG 18X100X1.27 (BLADE) IMPLANT
BOWL SMART MIX CTS (DISPOSABLE) ×2 IMPLANT
CAPT KNEE TOTAL 3 ×1 IMPLANT
CEMENT BONE 1-PACK (Cement) ×4 IMPLANT
CLOTH BEACON ORANGE TIMEOUT ST (SAFETY) ×2 IMPLANT
CUFF TOURN SGL QUICK 34 (TOURNIQUET CUFF) ×2
CUFF TRNQT CYL 34X4X40X1 (TOURNIQUET CUFF) ×1 IMPLANT
DRAPE U-SHAPE 47X51 STRL (DRAPES) ×2 IMPLANT
DRSG AQUACEL AG ADV 3.5X10 (GAUZE/BANDAGES/DRESSINGS) ×1 IMPLANT
DRSG PAD ABDOMINAL 8X10 ST (GAUZE/BANDAGES/DRESSINGS) ×3 IMPLANT
DURAPREP 26ML APPLICATOR (WOUND CARE) ×2 IMPLANT
ELECT REM PT RETURN 9FT ADLT (ELECTROSURGICAL) ×2
ELECTRODE REM PT RTRN 9FT ADLT (ELECTROSURGICAL) ×1 IMPLANT
GAUZE SPONGE 4X4 12PLY STRL (GAUZE/BANDAGES/DRESSINGS) ×2 IMPLANT
GAUZE XEROFORM 1X8 LF (GAUZE/BANDAGES/DRESSINGS) IMPLANT
GLOVE BIO SURGEON STRL SZ7.5 (GLOVE) ×2 IMPLANT
GLOVE BIOGEL PI IND STRL 6.5 (GLOVE) IMPLANT
GLOVE BIOGEL PI IND STRL 7.5 (GLOVE) IMPLANT
GLOVE BIOGEL PI IND STRL 8 (GLOVE) ×2 IMPLANT
GLOVE BIOGEL PI INDICATOR 6.5 (GLOVE) ×1
GLOVE BIOGEL PI INDICATOR 7.5 (GLOVE) ×2
GLOVE BIOGEL PI INDICATOR 8 (GLOVE) ×2
GLOVE ECLIPSE 7.5 STRL STRAW (GLOVE) ×2 IMPLANT
GLOVE SURG SS PI 6.5 STRL IVOR (GLOVE) ×1 IMPLANT
GLOVE SURG SS PI 7.5 STRL IVOR (GLOVE) ×1 IMPLANT
GOWN STRL REUS W/ TWL LRG LVL3 (GOWN DISPOSABLE) IMPLANT
GOWN STRL REUS W/TWL LRG LVL3 (GOWN DISPOSABLE) ×3 IMPLANT
GOWN STRL REUS W/TWL XL LVL3 (GOWN DISPOSABLE) ×4 IMPLANT
HANDPIECE INTERPULSE COAX TIP (DISPOSABLE) ×2
IMMOBILIZER KNEE 20 (SOFTGOODS) ×2
IMMOBILIZER KNEE 20 THIGH 36 (SOFTGOODS) ×1 IMPLANT
NS IRRIG 1000ML POUR BTL (IV SOLUTION) ×1 IMPLANT
PACK TOTAL KNEE CUSTOM (KITS) ×2 IMPLANT
PADDING CAST COTTON 6X4 STRL (CAST SUPPLIES) ×3 IMPLANT
POSITIONER SURGICAL ARM (MISCELLANEOUS) ×2 IMPLANT
SET HNDPC FAN SPRY TIP SCT (DISPOSABLE) ×1 IMPLANT
SET PAD KNEE POSITIONER (MISCELLANEOUS) ×2 IMPLANT
STAPLER VISISTAT 35W (STAPLE) IMPLANT
STRIP CLOSURE SKIN 1/2X4 (GAUZE/BANDAGES/DRESSINGS) ×2 IMPLANT
SUCTION FRAZIER 12FR DISP (SUCTIONS) ×2 IMPLANT
SUT MNCRL AB 4-0 PS2 18 (SUTURE) ×1 IMPLANT
SUT VIC AB 0 CT1 27 (SUTURE) ×2
SUT VIC AB 0 CT1 27XBRD ANTBC (SUTURE) ×1 IMPLANT
SUT VIC AB 1 CT1 27 (SUTURE) ×6
SUT VIC AB 1 CT1 27XBRD ANTBC (SUTURE) ×2 IMPLANT
SUT VIC AB 2-0 CT1 27 (SUTURE) ×4
SUT VIC AB 2-0 CT1 TAPERPNT 27 (SUTURE) ×2 IMPLANT
SYR 50ML LL SCALE MARK (SYRINGE) ×1 IMPLANT
TRAY FOLEY W/METER SILVER 14FR (SET/KITS/TRAYS/PACK) ×1 IMPLANT
TRAY FOLEY W/METER SILVER 16FR (SET/KITS/TRAYS/PACK) ×1 IMPLANT
WATER STERILE IRR 1500ML POUR (IV SOLUTION) ×1 IMPLANT
WRAP KNEE MAXI GEL POST OP (GAUZE/BANDAGES/DRESSINGS) ×2 IMPLANT
YANKAUER SUCT BULB TIP 10FT TU (MISCELLANEOUS) ×2 IMPLANT

## 2016-01-30 NOTE — Op Note (Signed)
[name], [name] NO.:  [id number]  MEDICAL RECORD NO.:  [id number]  LOCATION:                               FACILITY:  [facility]  PHYSICIAN:  Vanita Panda. Magnus Ivan, M.D.DATE OF BIRTH:  01-Mar-1961  DATE OF PROCEDURE:  01/30/2016 DATE OF DISCHARGE:                              OPERATIVE REPORT   PREOPERATIVE DIAGNOSIS:  Severe osteoarthritis and degenerative joint disease, left knee.  POSTOPERATIVE DIAGNOSIS:  Severe osteoarthritis and degenerative joint disease, left knee.  PROCEDURE:  Left total knee arthroplasty.  IMPLANTS:  Stryker Triathlon knee with size 2 femur, size 3 tibial tray, 9 mm fix-bearing polyethylene insert, size 28 patellar button.  SURGEON:  Vanita Panda. Magnus Ivan, M.D.  ASSISTANT:  Richardean Canal, PA-C.  ANESTHESIA: 1. General. 2. Local with intra-articular Exparel injection.  ANTIBIOTICS:  2 g IV Ancef.  BLOOD LOSS:  Less than 200 mL.  TOURNIQUET TIME:  Less than 1 hour.  COMPLICATIONS:  None.  INDICATIONS:  Ms. Patricia [name] is a 55 year old with debilitating arthritis involving her left knee.  She had previous left knee arthroscopy years ago, which was mainly into the lateral compartment of her knee.  She has developed overtime lateral compartment arthritis as well as severe patellofemoral lateralizing global pain.  Her knee swells quite a bit and has tried and failed all forms of conservative treatment.  At this point, she wished to proceed with total knee arthroplasty due to her decreased quality of life, her decreased mobility, and her daily pain. She understands our goals are, improved quality of life, decreased pain, and improved mobility.  She understands the risk of acute blood loss, anemia, nerve and vessel injury, fracture, infection, and DVT.  She understands our goals.  PROCEDURE DESCRIPTION:  After informed consent was obtained, appropriate left knee was marked.  She was brought to the operating room.   General anesthesia was obtained while she was on the operating table.  A nonsterile tourniquet was placed around her upper left thigh and her left leg was prepped and draped with DuraPrep and sterile drapes.  Time- out was called.  She was identified as correct patient, correct left leg.  We then used an Esmarch to wrap out the leg and tourniquet was inflated to 300 mm of pressure.  I made a direct midline incision over the knee and carried this proximally and distally.  We dissected down the knee joint and carried out a medial parapatellar arthrotomy finding a very large joint effusion, finding the patellofemoral joint and lateral compartment denuding cartilage and there were periarticular osteophytes medially.  We then removed the ACL, PCL, medial and lateral meniscus with the knee in a flexed position.  We used extramedullary cutting guide, correcting for neutral slope for varus and valgus taking 9 mm off the high side.  We made this cut without difficulty.  We then went to the femur for intramedullary guide placed through the intercondylar notch, setting our distal femoral cutting guide for 10 mm distal femoral resection at 5 degrees externally rotated to left.  We made this cut without difficulty and brought the knee back down into full extension with 9 mm extension block, we achieved full extension. We went back to the femur  and put a femoral sizing guide, based off the epicondylar axis and Whiteside line.  We chose a size 2 femur based off this.  We then put 4 in 1 cutting block for a size 2 femur, made our anterior and posterior cuts followed by our chamfer cuts.  We then made our femoral box cut.  We then went back to the tibia and trialed for a size 3 tibia as well, which gave [place] good coverage over the tibia.  We set our rotation of the femur and the tibial tubercle and then made our keel cut off this.  With the trial 3 tibial tray and trial 2 femur, we tried 9 mm fix-bearing  polyethylene insert and brought the knee back down in extension and put her through with active flexion and extension. We pleased with alignment and range of motion and stability.  We then made our patella cut without difficulty.  We drilled 3 holes for size 28 patellar button.  With all trial components in place, again we were pleased with mobility.  We then removed all trial components and irrigated the knee with normal saline solution using pulsatile lavage. We infiltrated the joint capsule with our mixture of 20 mL of Exparel and 40 mL of normal saline.  We then mixed our cement and cemented the real Stryker Triathlon tibial tray size 3, followed by the real size 2 femur.  We cemented the real patellar button and placed the real 9 mm fix-bearing polyethylene insert.  Once the cement debris had been removed and the cement had hardened with the tourniquet down, hemostasis was obtained with electrocautery.  We then irrigated the knee one more time with normal saline solution using pulsatile lavage. We closed the joint capsule with interrupted #1 Ethibond suture followed by 0 Vicryl in deep tissue, 2-0 Vicryl subcutaneous tissue, 4-0 Monocryl subcuticular stitch, and Steri-Strips on the skin.  Well-padded sterile dressing was applied and she was awakened, extubated, taken to the recovery room in stable condition.  All final counts were correct. There were no complications noted.  Of note, Richardean Canal, PA-C assisted in the entire case.  His assistance was crucial for facilitating all aspects of this case.     Vanita Panda. Magnus Ivan, M.D.     CYB/MEDQ  D:  01/30/2016  T:  01/30/2016  Job:  [id number]

## 2016-01-30 NOTE — Progress Notes (Signed)
6th floor notified 1615 will be there in 20 minutes and needs pulsox and pump.

## 2016-01-30 NOTE — Anesthesia Postprocedure Evaluation (Signed)
Anesthesia Post Note  Patient: Patricia [name]  Procedure(s) Performed: Procedure(s) (LRB): LEFT TOTAL KNEE ARTHROPLASTY (Left)  Patient location during evaluation: PACU Anesthesia Type: General Level of consciousness: sedated Pain management: satisfactory to patient Vital Signs Assessment: post-procedure vital signs reviewed and stable Respiratory status: spontaneous breathing Cardiovascular status: stable Anesthetic complications: no    Last Vitals:  Filed Vitals:   01/30/16 1301 01/30/16 1403  BP: 151/77 137/74  Pulse: 111 113  Temp: 37 C 36.8 C  Resp: 16 16    Last Pain:  Filed Vitals:   01/30/16 1403  PainSc: 5                  Aislinn Feliz EDWARD

## 2016-01-30 NOTE — H&P (Signed)
TOTAL KNEE ADMISSION H&P  Patient is being admitted for left total knee arthroplasty.  Subjective:  Chief Complaint:left knee pain.  HPI: Patricia [name], 55 y.o. female, has a history of pain and functional disability in the left knee due to arthritis and has failed non-surgical conservative treatments for greater than 12 weeks to includeNSAID's and/or analgesics, corticosteriod injections, viscosupplementation injections, flexibility and strengthening excercises and activity modification.  Onset of symptoms was gradual, starting 5 years ago with gradually worsening course since that time. The patient noted prior procedures on the knee to include  arthroscopy on the left knee(s).  Patient currently rates pain in the left knee(s) at 10 out of 10 with activity. Patient has night pain, worsening of pain with activity and weight bearing, pain that interferes with activities of daily living, pain with passive range of motion and crepitus.  Patient has evidence of subchondral sclerosis, periarticular osteophytes and joint space narrowing by imaging studies. There is no active infection.  Patient Active Problem List   Diagnosis Date Noted  . Osteoarthritis of left knee 01/30/2016   Past Medical History  Diagnosis Date  . Anxiety   . Dysplasia of cervix, low grade (CIN 1) 1987    cryo  . Hyperlipemia   . Arthritis     oa  . Loose body in joint     right knee  . Complication of anesthesia     does not like needles, has passsed out with needle insertion    Past Surgical History  Procedure Laterality Date  . Liposuction    . Knee surgery Left 12-12-14    lateral release and meniscus repair  . Eye surgery Bilateral 01-13-2005  . Uterine ablation  07-14-2007  . Augmentation mammaplasty      2009 ruptured silicone implants, replaced with saline 2009  . Joint replacement  12-13-2009    toe joint left big toe    Prescriptions prior to admission  Medication Sig Dispense Refill Last Dose  .  Acetaminophen-Aspirin Buffered (EXCEDRIN BACK & BODY PO) Take 1 tablet by mouth daily as needed (pain).   01/25/2016  . Artificial Tear Ointment (DRY EYES OP) Apply 1 drop to eye every morning.   01/29/2016 at am  . B Complex-C (B-COMPLEX WITH VITAMIN C) tablet Take 1 tablet by mouth daily.   01/25/2016  . Calcium Carbonate-Vitamin D (CALCIUM + D PO) Take 1 tablet by mouth every morning.    01/25/2016  . cetirizine (ZYRTEC) 10 MG tablet Take 10 mg by mouth every morning.    01/30/2016 at 0430  . Cholecalciferol (VITAMIN D PO) Take 1,000 Int'l Units by mouth every morning.    01/25/2016  . diazepam (VALIUM) 5 MG tablet Take 5 mg by mouth once.   0430  . Glucosamine HCl (GLUCOSAMINE PO) Take 1 tablet by mouth every morning.    01/25/2016  . ibuprofen (ADVIL) 200 MG tablet Take 200 mg by mouth daily.   01/25/2016  . naproxen sodium (ANAPROX) 220 MG tablet Take 220 mg by mouth 2 (two) times daily as needed (pain).   01/25/2016  . PARoxetine (PAXIL) 10 MG tablet Take 1 tablet (10 mg total) by mouth every morning. 30 tablet 11 01/30/2016 at 0500  . Acetaminophen (TYLENOL ARTHRITIS PAIN PO) Take 650 mg by mouth every morning.   01/25/2016   No Known Allergies  Social History  Substance Use Topics  . Smoking status: Never Smoker   . Smokeless tobacco: Never Used  . Alcohol Use: 1.2 -  2.4 oz/week    2-4 Standard drinks or equivalent per week     Comment: occasional wine    Family History  Problem Relation Age of Onset  . Hypertension Mother 52  . Alzheimer's disease Mother   . Lymphoma Father   . Cancer Father     brain tumor     Review of Systems  All other systems reviewed and are negative.   Objective:  Physical Exam  Constitutional: She is oriented to person, place, and time. She appears well-developed and well-nourished.  HENT:  Head: Normocephalic and atraumatic.  Eyes: EOM are normal. Pupils are equal, round, and reactive to light.  Neck: Normal range of motion. Neck supple.   Cardiovascular: Normal rate and regular rhythm.   Respiratory: Effort normal and breath sounds normal.  GI: Soft. Bowel sounds are normal.  Musculoskeletal:       Left knee: She exhibits decreased range of motion, swelling and effusion. Tenderness found. Lateral joint line tenderness noted.  Neurological: She is alert and oriented to person, place, and time.  Skin: Skin is warm and dry.  Psychiatric: She has a normal mood and affect.    Vital signs in last 24 hours: Temp:  [97.7 F (36.5 C)] 97.7 F (36.5 C) (02/17 0539) Pulse Rate:  [93] 93 (02/17 0539) Resp:  [16] 16 (02/17 0539) BP: (141)/(85) 141/85 mmHg (02/17 0539) SpO2:  [100 %] 100 % (02/17 0539) Weight:  [71.838 kg (158 lb 6 oz)] 71.838 kg (158 lb 6 oz) (02/17 0559)  Labs:   Estimated body mass index is 25.57 kg/(m^2) as calculated from the following:   Height as of this encounter:  (1.676 m).   Weight as of this encounter: 71.838 kg (158 lb 6 oz).   Imaging Review Plain radiographs demonstrate severe degenerative joint disease of the left knee(s). The overall alignment ismild valgus. The bone quality appears to be excellent for age and reported activity level.  Assessment/Plan:  End stage arthritis, left knee   The patient history, physical examination, clinical judgment of the provider and imaging studies are consistent with end stage degenerative joint disease of the left knee(s) and total knee arthroplasty is deemed medically necessary. The treatment options including medical management, injection therapy arthroscopy and arthroplasty were discussed at length. The risks and benefits of total knee arthroplasty were presented and reviewed. The risks due to aseptic loosening, infection, stiffness, patella tracking problems, thromboembolic complications and other imponderables were discussed. The patient acknowledged the explanation, agreed to proceed with the plan and consent was signed. Patient is being admitted  for inpatient treatment for surgery, pain control, PT, OT, prophylactic antibiotics, VTE prophylaxis, progressive ambulation and ADL's and discharge planning. The patient is planning to be discharged home with home health services

## 2016-01-30 NOTE — Brief Op Note (Signed)
01/30/2016  8:52 AM  PATIENT:  Patricia [name]  55 y.o. female  PRE-OPERATIVE DIAGNOSIS:  Osteoarthritis left knee  POST-OPERATIVE DIAGNOSIS:  Osteoarthritis left knee  PROCEDURE:  Procedure(s): LEFT TOTAL KNEE ARTHROPLASTY (Left)  SURGEON:  Surgeon(s) and Role:    * Kathryne Hitch, MD - Primary  PHYSICIAN ASSISTANT: Rexene Edison, PA-C  ANESTHESIA:   local and general  EBL:  Total I/O In: 1110 [I.V.:1000; IV Piggyback:110] Out: 50 [Blood:50]  LOCAL MEDICATIONS USED:  OTHER Experil  COUNTS:  YES  TOURNIQUET:   Total Tourniquet Time Documented: area (Left) - 55 minutes Total: area (Left) - 55 minutes   DICTATION: .Other Dictation: Dictation Number [id number]  PLAN OF CARE: Admit to inpatient   PATIENT DISPOSITION:  PACU - hemodynamically stable.   Delay start of Pharmacological VTE agent (>24hrs) due to surgical blood loss or risk of bleeding: no

## 2016-01-30 NOTE — Transfer of Care (Signed)
Immediate Anesthesia Transfer of Care Note  Patient: Patricia [name]  Procedure(s) Performed: Procedure(s): LEFT TOTAL KNEE ARTHROPLASTY (Left)  Patient Location: PACU  Anesthesia Type:General  Level of Consciousness: Patient easily awoken, sedated, comfortable, cooperative, following commands, responds to stimulation.   Airway & Oxygen Therapy: Patient spontaneously breathing, ventilating well, oxygen via simple oxygen mask.  Post-op Assessment: Report given to PACU RN, vital signs reviewed and stable, moving all extremities.   Post vital signs: Reviewed and stable.  Complications: No apparent anesthesia complications

## 2016-01-30 NOTE — Progress Notes (Signed)
Utilization review completed.  

## 2016-01-30 NOTE — Evaluation (Signed)
Physical Therapy Evaluation Patient Details Name: Patricia [name] MRN: [id number] DOB: 06/23/1961 Today's Date: 01/30/2016   History of Present Illness  L TKR  Clinical Impression  Pt s/p L TKR presents with decreased L LE strength/ROM and post op pain limiting functional mobility.  Pt should progress well to dc home with family assist and HHPT follow up.    Follow Up Recommendations Home health PT    Equipment Recommendations  None recommended by PT    Recommendations for Other Services OT consult     Precautions / Restrictions Precautions Precautions: Knee;Fall Required Braces or Orthoses: Knee Immobilizer - Left Knee Immobilizer - Left: Discontinue once straight leg raise with < 10 degree lag Restrictions Weight Bearing Restrictions: No Other Position/Activity Restrictions: WBAT      Mobility  Bed Mobility Overal bed mobility: Needs Assistance Bed Mobility: Supine to Sit;Sit to Supine     Supine to sit: Min assist Sit to supine: Min assist   General bed mobility comments: cues for sequence and use of R LE to self assist  Transfers Overall transfer level: Needs assistance Equipment used: Rolling walker (2 wheeled) Transfers: Sit to/from Stand Sit to Stand: Min assist         General transfer comment: cues for LE management and use of UEs to self assist  Ambulation/Gait Ambulation/Gait assistance: Min assist Ambulation Distance (Feet): 38 Feet (and 15 from bathroom) Assistive device: Rolling walker (2 wheeled) Gait Pattern/deviations: Step-to pattern;Decreased step length - right;Decreased step length - left;Shuffle;Trunk flexed Gait velocity: decr Gait velocity interpretation: Below normal speed for age/gender General Gait Details: cues for sequence, posture and position from [employer]            Wheelchair Mobility    Modified Rankin (Stroke Patients Only)       Balance                                              Pertinent Vitals/Pain Pain Assessment: 0-10 Pain Score: 5  Pain Location: L knee Pain Descriptors / Indicators: Aching;Sore Pain Intervention(s): Limited activity within patient's tolerance;Monitored during session;Premedicated before session;Ice applied    Home Living Family/patient expects to be discharged to:: Private residence Living Arrangements: Spouse/significant other Available Help at Discharge: Family Type of Home: House Home Access: Stairs to enter Entrance Stairs-Rails: None Entrance Stairs-Number of Steps: 3 Home Layout: Two level Home Equipment: Environmental consultant - 2 wheels;Cane - single point;Crutches      Prior Function Level of Independence: Independent               Hand Dominance        Extremity/Trunk Assessment   Upper Extremity Assessment: Overall WFL for tasks assessed           Lower Extremity Assessment: LLE deficits/detail      Cervical / Trunk Assessment: Normal  Communication   Communication: No difficulties  Cognition Arousal/Alertness: Awake/alert Behavior During Therapy: WFL for tasks assessed/performed Overall Cognitive Status: Within Functional Limits for tasks assessed                      General Comments      Exercises Total Joint Exercises Ankle Circles/Pumps: AROM;Both;15 reps;Supine      Assessment/Plan    PT Assessment Patient needs continued PT services  PT Diagnosis Difficulty walking   PT Problem List Decreased  strength;Decreased range of motion;Decreased activity tolerance;Decreased mobility;Decreased knowledge of use of DME;Pain  PT Treatment Interventions DME instruction;Gait training;Stair training;Functional mobility training;Therapeutic activities;Therapeutic exercise;Patient/family education   PT Goals (Current goals can be found in the Care Plan section) Acute Rehab PT Goals Patient Stated Goal: Walk without pain or a limp PT Goal Formulation: With patient Time For Goal Achievement:  02/03/16 Potential to Achieve Goals: Good    Frequency 7X/week   Barriers to discharge        Co-evaluation               End of Session Equipment Utilized During Treatment: Left knee immobilizer;Gait belt Activity Tolerance: Patient tolerated treatment well Patient left: in bed;with call bell/phone within reach;with family/visitor present Nurse Communication: Mobility status         Time: 1610-9604 PT Time Calculation (min) (ACUTE ONLY): 34 min   Charges:   PT Evaluation $PT Eval Low Complexity: 1 Procedure PT Treatments $Gait Training: 8-22 mins   PT G Codes:        Aya Geisel 02/20/2016, 4:46 PM

## 2016-01-30 NOTE — Op Note (Deleted)
[name], [name] NO.:  [id number]  MEDICAL RECORD NO.:  [id number]  LOCATION:                               FACILITY:  [facility]  PHYSICIAN:  Vanita Panda. Magnus Ivan, M.D.DATE OF BIRTH:  01-Mar-1961  DATE OF PROCEDURE:  01/30/2016 DATE OF DISCHARGE:                              OPERATIVE REPORT   PREOPERATIVE DIAGNOSIS:  Severe osteoarthritis and degenerative joint disease, left knee.  POSTOPERATIVE DIAGNOSIS:  Severe osteoarthritis and degenerative joint disease, left knee.  PROCEDURE:  Left total knee arthroplasty.  IMPLANTS:  Stryker Triathlon knee with size 2 femur, size 3 tibial tray, 9 mm fix-bearing polyethylene insert, size 28 patellar button.  SURGEON:  Vanita Panda. Magnus Ivan, M.D.  ASSISTANT:  Richardean Canal, PA-C.  ANESTHESIA: 1. General. 2. Local with intra-articular Exparel injection.  ANTIBIOTICS:  2 g IV Ancef.  BLOOD LOSS:  Less than 200 mL.  TOURNIQUET TIME:  Less than 1 hour.  COMPLICATIONS:  None.  INDICATIONS:  Ms. Detlefsen is a 55 year old with debilitating arthritis involving her left knee.  She had previous left knee arthroscopy years ago, which was mainly into the lateral compartment of her knee.  She has developed overtime lateral compartment arthritis as well as severe patellofemoral lateralizing global pain.  Her knee swells quite a bit and has tried and failed all forms of conservative treatment.  At this point, she wished to proceed with total knee arthroplasty due to her decreased quality of life, her decreased mobility, and her daily pain. She understands our goals are, improved quality of life, decreased pain, and improved mobility.  She understands the risk of acute blood loss, anemia, nerve and vessel injury, fracture, infection, and DVT.  She understands our goals.  PROCEDURE DESCRIPTION:  After informed consent was obtained, appropriate left knee was marked.  She was brought to the operating room.   General anesthesia was obtained while she was on the operating table.  A nonsterile tourniquet was placed around her upper left thigh and her left leg was prepped and draped with DuraPrep and sterile drapes.  Time- out was called.  She was identified as correct patient, correct left leg.  We then used an Esmarch to wrap out the leg and tourniquet was inflated to 300 mm of pressure.  I made a direct midline incision over the knee and carried this proximally and distally.  We dissected down the knee joint and carried out a medial parapatellar arthrotomy finding a very large joint effusion, finding the patellofemoral joint and lateral compartment denuding cartilage and there were periarticular osteophytes medially.  We then removed the ACL, PCL, medial and lateral meniscus with the knee in a flexed position.  We used extramedullary cutting guide, correcting for neutral slope for varus and valgus taking 9 mm off the high side.  We made this cut without difficulty.  We then went to the femur for intramedullary guide placed through the intercondylar notch, setting our distal femoral cutting guide for 10 mm distal femoral resection at 5 degrees externally rotated to left.  We made this cut without difficulty and brought the knee back down into full extension with 9 mm extension block, we achieved full extension. We went back to the femur  and put a femoral sizing guide, based off the epicondylar axis and Whiteside line.  We chose a size 2 femur based off this.  We then put 4 in 1 cutting block for a size 2 femur, made our anterior and posterior cuts followed by our chamfer cuts.  We then made our femoral box cut.  We then went back to the tibia and trialed for a size 3 tibia as well, which gave us good coverage over the tibia.  We set our rotation of the femur and the tibial tubercle and then made our keel cut off this.  With the trial 3 tibial tray and trial 2 femur, we tried 9 mm fix-bearing  polyethylene insert and brought the knee back down in extension and put her through with active flexion and extension. We pleased with alignment and range of motion and stability.  We then made our patella cut without difficulty.  We drilled 3 holes for size 28 patellar button.  With all trial components in place, again we were pleased with mobility.  We then removed all trial components and irrigated the knee with normal saline solution using pulsatile lavage. We infiltrated the joint capsule with our mixture of 20 mL of Exparel and 40 mL of normal saline.  We then mixed our cement and cemented the real Stryker Triathlon tibial tray size 3, followed by the real size 2 femur.  We cemented the real patellar button and placed the real 9 mm fix-bearing polyethylene insert.  Once the cement debris had been removed and the cement had hardened with the tourniquet down, hemostasis was obtained with electrocautery.  We then irrigated the knee one more time with normal saline solution using pulsatile lavage. We closed the joint capsule with interrupted #1 Ethibond suture followed by 0 Vicryl in deep tissue, 2-0 Vicryl subcutaneous tissue, 4-0 Monocryl subcuticular stitch, and Steri-Strips on the skin.  Well-padded sterile dressing was applied and she was awakened, extubated, taken to the recovery room in stable condition.  All final counts were correct. There were no complications noted.  Of note, Gilbert Clark, PA-C assisted in the entire case.  His assistance was crucial for facilitating all aspects of this case.     Solomia Harrell Y. Lupita Rosales, M.D.     CYB/MEDQ  D:  01/30/2016  T:  01/30/2016  Job:  [id number] 

## 2016-01-30 NOTE — Anesthesia Procedure Notes (Signed)
Procedure Name: Intubation Date/Time: 01/30/2016 7:29 AM Performed by: Ludwig Lean Pre-anesthesia Checklist: Patient identified, Emergency Drugs available, Suction available and Patient being monitored Patient Re-evaluated:Patient Re-evaluated prior to inductionOxygen Delivery Method: Circle System Utilized Preoxygenation: Pre-oxygenation with 100% oxygen Intubation Type: IV induction Ventilation: Mask ventilation without difficulty Laryngoscope Size: Mac and 3 Grade View: Grade III Tube type: Oral Tube size: 7.0 mm Number of attempts: 1 Airway Equipment and Method: Stylet and Oral airway Placement Confirmation: ETT inserted through vocal cords under direct vision,  positive ETCO2 and breath sounds checked- equal and bilateral Secured at: 21 cm Tube secured with: Tape Dental Injury: Teeth and Oropharynx as per pre-operative assessment  Difficulty Due To: Difficulty was anticipated and Difficult Airway- due to anterior larynx

## 2016-01-31 LAB — BASIC METABOLIC PANEL
ANION GAP: 7 (ref 5–15)
BUN: 13 mg/dL (ref 6–20)
CALCIUM: 9.1 mg/dL (ref 8.9–10.3)
CHLORIDE: 103 mmol/L (ref 101–111)
CO2: 28 mmol/L (ref 22–32)
Creatinine, Ser: 0.72 mg/dL (ref 0.44–1.00)
GFR calc non Af Amer: >60 mL/min (ref >60)
GLUCOSE: 112 mg/dL — AB (ref 65–99)
Potassium: 4.3 mmol/L (ref 3.5–5.1)
Sodium: 138 mmol/L (ref 135–145)

## 2016-01-31 LAB — CBC
HEMATOCRIT: 33.6 % — AB (ref 36.0–46.0)
HEMOGLOBIN: 11.1 g/dL — AB (ref 12.0–15.0)
MCH: 30.2 pg (ref 26.0–34.0)
MCHC: 33 g/dL (ref 30.0–36.0)
MCV: 91.6 fL (ref 78.0–100.0)
Platelets: 222 10*3/uL (ref 150–400)
RBC: 3.67 MIL/uL — ABNORMAL LOW (ref 3.87–5.11)
RDW: 12.5 % (ref 11.5–15.5)
WBC: 12.3 10*3/uL — AB (ref 4.0–10.5)

## 2016-01-31 NOTE — Care Management Note (Signed)
Case Management Note  Patient Details  Name: Patricia [name] MRN: [id number] Date of Birth: Jul 10, 1961  Subjective/Objective:     L TKR               Action/Plan: NCM spoke to pt and offered choice/ HH list provided. Pt agreeable to [facility] with [facility]. States she has a RW at home that she borrowed from a friend. States she will let NCM know if she decide on 3n1 for home. States she has high toilets at home. Husband, Virl Diamond will be at home to assist with her care.   Expected Discharge Date:  02/01/2016              Expected Discharge Plan:  Home w Home Health Services  In-House Referral:  NA  Discharge planning Services  CM Consult  Post Acute Care Choice:  Home Health Choice offered to:  Patient  DME Arranged:  N/A DME Agency:  NA  HH Arranged:  PT HH Agency:  [facility]  Status of Service:  Completed, signed off  Medicare Important Message Given:    Date Medicare IM Given:    Medicare IM give by:    Date Additional Medicare IM Given:    Additional Medicare Important Message give by:     If discussed at Long Length of Stay Meetings, dates discussed:    Additional Comments:  Elliot Cousin, RN 01/31/2016, 4:18 PM

## 2016-01-31 NOTE — Progress Notes (Signed)
Physical Therapy Treatment Patient Details Name: Patricia [name] MRN: [id number] DOB: [birth year] Today's Date: 01/31/2016    History of Present Illness L TKR    PT Comments    Pt progressing steadily with mobility - ROM ltd by pain and pt unable to complete IND SLR this am  Follow Up Recommendations  Home health PT     Equipment Recommendations  None recommended by PT    Recommendations for Other Services OT consult     Precautions / Restrictions Precautions Precautions: Knee;Fall Required Braces or Orthoses: Knee Immobilizer - Left Knee Immobilizer - Left: Discontinue once straight leg raise with < 10 degree lag Restrictions Weight Bearing Restrictions: No Other Position/Activity Restrictions: WBAT    Mobility  Bed Mobility Overal bed mobility: Needs Assistance Bed Mobility: Sit to Supine     Supine to sit: Min assist Sit to supine: Min assist   General bed mobility comments: assist for L LE back onto bed.   Transfers Overall transfer level: Needs assistance Equipment used: Rolling walker (2 wheeled) Transfers: Sit to/from Stand Sit to Stand: Min assist         General transfer comment: cues for hand placement and LE management.  Ambulation/Gait Ambulation/Gait assistance: Min assist;Min guard Ambulation Distance (Feet): 74 Feet Assistive device: Rolling walker (2 wheeled) Gait Pattern/deviations: Step-to pattern;Decreased step length - right;Decreased step length - left;Shuffle;Trunk flexed Gait velocity: decr Gait velocity interpretation: Below normal speed for age/gender General Gait Details: cues for sequence, posture, stride length and position from RW   Stairs            Wheelchair Mobility    Modified Rankin (Stroke Patients Only)       Balance                                    Cognition Arousal/Alertness: Awake/alert Behavior During Therapy: WFL for tasks assessed/performed Overall Cognitive Status:  Within Functional Limits for tasks assessed                      Exercises Total Joint Exercises Ankle Circles/Pumps: AROM;Both;15 reps;Supine Quad Sets: AROM;Both;10 reps;Supine Heel Slides: AAROM;Left;10 reps;Supine Straight Leg Raises: AAROM;Left;10 reps;Supine Goniometric ROM: AAROM L knww -10 - 50    General Comments        Pertinent Vitals/Pain Pain Assessment: 0-10 Pain Score: 8  Pain Location: L knee Pain Descriptors / Indicators: Aching Pain Intervention(s): Monitored during session;Repositioned;Ice applied;Patient requesting pain meds-RN notified    Home Living Family/patient expects to be discharged to:: Private residence Living Arrangements: Spouse/significant other Available Help at Discharge: Family Type of Home: House Home Access: Stairs to enter Entrance Stairs-Rails: None Home Layout: Two level Home Equipment: Environmental consultant - 2 wheels;Cane - single point;Crutches      Prior Function Level of Independence: Independent          PT Goals (current goals can now be found in the care plan section) Acute Rehab PT Goals Patient Stated Goal: decrease pain to move easier. PT Goal Formulation: With patient Time For Goal Achievement: 02/03/16 Potential to Achieve Goals: Good Progress towards PT goals: Progressing toward goals    Frequency  7X/week    PT Plan Current plan remains appropriate    Co-evaluation             End of Session Equipment Utilized During Treatment: Left knee immobilizer;Gait belt Activity Tolerance: Patient tolerated treatment well Patient  left: in chair;with call bell/phone within reach;with family/visitor present     Time: 1050-1116 PT Time Calculation (min) (ACUTE ONLY): 26 min  Charges:  $Gait Training: 8-22 mins $Therapeutic Exercise: 8-22 mins                    G Codes:      Brailyn Delman 2016/02/03, 12:54 PM

## 2016-01-31 NOTE — Evaluation (Addendum)
Occupational Therapy Evaluation Patient Details Name: Patricia [name] MRN: [id number] DOB: [birth year] Today's Date: 01/31/2016    History of Present Illness L TKR   Clinical Impression   Pt overall at min assist level with functional mobility but mod assist with LB self care due to pain with trying to reach down toward L foot for donning sock, etc. Spouse present and states he will assist with LB self care at home. Will follow on acute to progress ADL independence for return home with spouse.     Follow Up Recommendations  No OT follow up    Equipment Recommendations  3 in 1 bedside comode    Recommendations for Other Services       Precautions / Restrictions Precautions Precautions: Knee;Fall Required Braces or Orthoses: Knee Immobilizer - Left Knee Immobilizer - Left: Discontinue once straight leg raise with < 10 degree lag Restrictions Weight Bearing Restrictions: No Other Position/Activity Restrictions: WBAT      Mobility Bed Mobility Overal bed mobility: Needs Assistance Bed Mobility: Sit to Supine       Sit to supine: Min assist   General bed mobility comments: assist for L LE back onto bed.   Transfers Overall transfer level: Needs assistance Equipment used: Rolling walker (2 wheeled) Transfers: Sit to/from Stand Sit to Stand: Min assist         General transfer comment: cues for hand placement and LE management.    Balance                                            ADL Overall ADL's : Needs assistance/impaired Eating/Feeding: Independent;Sitting   Grooming: Wash/dry hands;Set up;Sitting   Upper Body Bathing: Set up;Sitting   Lower Body Bathing: Moderate assistance;Sit to/from stand   Upper Body Dressing : Set up;Sitting   Lower Body Dressing: Moderate assistance;Sit to/from stand   Toilet Transfer: Minimal assistance;Ambulation;BSC;RW   Toileting- Clothing Manipulation and Hygiene: Minimal assistance;Sit to/from  stand         General ADL Comments: Pt attempting to make too big of a turn with her walker to exit bathroom so advised pt on taking small steps with walker to make a turn for safety and to avoid twisting on knee. Pt with 8/10 pain during session. Discussed use of 3in1 as shower chair and option of a HH shower. Discussed AE but pt states she will have spouse assist with LB dressing. Discussed at length importance of KI and how to don/doff. Also emphasized need to wear KI whenever up until pt is able to SLR.     Vision     Perception     Praxis      Pertinent Vitals/Pain Pain Assessment: 0-10 Pain Score: 8  Pain Location: L knee Pain Descriptors / Indicators: Aching Pain Intervention(s): Monitored during session;Repositioned;Ice applied;Patient requesting pain meds-RN notified     Hand Dominance     Extremity/Trunk Assessment Upper Extremity Assessment Upper Extremity Assessment: Overall WFL for tasks assessed           Communication Communication Communication: No difficulties   Cognition Arousal/Alertness: Awake/alert Behavior During Therapy: WFL for tasks assessed/performed Overall Cognitive Status: Within Functional Limits for tasks assessed                     General Comments       Exercises  Shoulder Instructions      Home Living Family/patient expects to be discharged to:: Private residence Living Arrangements: Spouse/significant other Available Help at Discharge: Family Type of Home: House Home Access: Stairs to enter Secretary/administrator of Steps: 3 Entrance Stairs-Rails: None Home Layout: Two level Alternate Level Stairs-Number of Steps: 14 Alternate Level Stairs-Rails: Right Bathroom Shower/Tub: Producer, television/film/video: Handicapped height     Home Equipment: Environmental consultant - 2 wheels;Cane - single point;Crutches          Prior Functioning/Environment Level of Independence: Independent             OT Diagnosis:  Generalized weakness;Acute pain   OT Problem List: Decreased strength;Pain;Decreased knowledge of use of DME or AE   OT Treatment/Interventions: Self-care/ADL training;DME and/or AE instruction;Patient/family education    OT Goals(Current goals can be found in the care plan section) Acute Rehab OT Goals Patient Stated Goal: decrease pain to move easier. OT Goal Formulation: With patient Time For Goal Achievement: 02/07/16 Potential to Achieve Goals: Good  OT Frequency: Min 2X/week   Barriers to D/C:            Co-evaluation              End of Session Equipment Utilized During Treatment: Rolling walker;Left knee immobilizer  Activity Tolerance: Patient limited by pain Patient left: in bed;with call bell/phone within reach;with family/visitor present   Time: 1610-9604 OT Time Calculation (min): 32 min Charges:  OT General Charges $OT Visit: 1 Procedure OT Evaluation $OT Eval Low Complexity: 1 Procedure OT Treatments $Therapeutic Activity: 8-22 mins G-Codes:    Lennox Laity  [phone] 01/31/2016, 12:47 PM

## 2016-01-31 NOTE — Progress Notes (Signed)
Subjective: 1 Day Post-Op Procedure(s) (LRB): LEFT TOTAL KNEE ARTHROPLASTY (Left) Patient reports pain as moderate.    Objective: Vital signs in last 24 hours: Temp:  [97.6 F (36.4 C)-99.3 F (37.4 C)] 98.3 F (36.8 C) (02/18 0845) Pulse Rate:  [71-113] 86 (02/18 0845) Resp:  [8-18] 16 (02/18 0530) BP: (117-159)/(56-93) 119/56 mmHg (02/18 0845) SpO2:  [95 %-100 %] 98 % (02/18 0845)  Intake/Output from previous day: 02/17 0701 - 02/18 0700 In: 4030 [P.O.:720; I.V.:3200; IV Piggyback:110] Out: 1350 [Urine:1300; Blood:50] Intake/Output this shift: Total I/O In: 240 [P.O.:240] Out: -    Recent Labs  01/31/16 0436  HGB 11.1*    Recent Labs  01/31/16 0436  WBC 12.3*  RBC 3.67*  HCT 33.6*  PLT 222    Recent Labs  01/31/16 0436  NA 138  K 4.3  CL 103  CO2 28  BUN 13  CREATININE 0.72  GLUCOSE 112*  CALCIUM 9.1   No results for input(s): LABPT, INR in the last 72 hours.  Sensation intact distally Intact pulses distally Dorsiflexion/Plantar flexion intact Incision: dressing C/D/I Compartment soft  Assessment/Plan: 1 Day Post-Op Procedure(s) (LRB): LEFT TOTAL KNEE ARTHROPLASTY (Left) Up with therapy  BLACKMAN,CHRISTOPHER Y 01/31/2016, 9:58 AM

## 2016-02-01 MED ORDER — OXYCODONE HCL 5 MG PO TABS: 5.0000 mg | ORAL_TABLET | ORAL | Status: DC | PRN

## 2016-02-01 MED ORDER — RIVAROXABAN 10 MG PO TABS: 10.0000 mg | ORAL_TABLET | Freq: Every day | ORAL | Status: DC

## 2016-02-01 MED ORDER — CYCLOBENZAPRINE HCL 10 MG PO TABS: 10.0000 mg | ORAL_TABLET | Freq: Three times a day (TID) | ORAL | Status: DC | PRN

## 2016-02-01 MED ORDER — OXYCODONE HCL 5 MG PO TABS
5.0000 mg | ORAL_TABLET | ORAL | Status: DC | PRN
Start: 2016-02-01 — End: 2016-02-01
  Administered 2016-02-01 (×2): 15 mg via ORAL
  Filled 2016-02-01 (×2): qty 3

## 2016-02-01 NOTE — Discharge Instructions (Signed)
Information on my medicine - XARELTO® (Rivaroxaban) ° °This medication education was reviewed with me or my healthcare representative as part of my discharge preparation.  The pharmacist that spoke with me during my hospital stay was:  Pickering, Thomas Robert, RPH ° °Why was Xarelto® prescribed for you? °Xarelto® was prescribed for you to reduce the risk of blood clots forming after orthopedic surgery. The medical term for these abnormal blood clots is venous thromboembolism (VTE). ° °What do you need to know about xarelto® ? °Take your Xarelto® ONCE DAILY at the same time every day. °You may take it either with or without food. ° °If you have difficulty swallowing the tablet whole, you may crush it and mix in applesauce just prior to taking your dose. ° °Take Xarelto® exactly as prescribed by your doctor and DO NOT stop taking Xarelto® without talking to the doctor who prescribed the medication.  Stopping without other VTE prevention medication to take the place of Xarelto® may increase your risk of developing a clot. ° °After discharge, you should have regular check-up appointments with your healthcare provider that is prescribing your Xarelto®.   ° °What do you do if you miss a dose? °If you miss a dose, take it as soon as you remember on the same day then continue your regularly scheduled once daily regimen the next day. Do not take two doses of Xarelto® on the same day.  ° °Important Safety Information °A possible side effect of Xarelto® is bleeding. You should call your healthcare provider right away if you experience any of the following: °? Bleeding from an injury or your nose that does not stop. °? Unusual colored urine (red or dark brown) or unusual colored stools (red or black). °? Unusual bruising for unknown reasons. °? A serious fall or if you hit your head (even if there is no bleeding). ° °Some medicines may interact with Xarelto® and might increase your risk of bleeding while on Xarelto®. To help  avoid this, consult your healthcare provider or pharmacist prior to using any new prescription or non-prescription medications, including herbals, vitamins, non-steroidal anti-inflammatory drugs (NSAIDs) and supplements. ° °This website has more information on Xarelto®: www.xarelto.com. ° ° °INSTRUCTIONS AFTER JOINT REPLACEMENT  ° °o Remove items at home which could result in a fall. This includes throw rugs or furniture in walking pathways °o ICE to the affected joint every three hours while awake for 30 minutes at a time, for at least the first 3-5 days, and then as needed for pain and swelling.  Continue to use ice for pain and swelling. You may notice swelling that will progress down to the foot and ankle.  This is normal after surgery.  Elevate your leg when you are not up walking on it.   °o Continue to use the breathing machine you got in the hospital (incentive spirometer) which will help keep your temperature down.  It is common for your temperature to cycle up and down following surgery, especially at night when you are not up moving around and exerting yourself.  The breathing machine keeps your lungs expanded and your temperature down. ° ° °DIET:  As you were doing prior to hospitalization, we recommend a well-balanced diet. ° °DRESSING / WOUND CARE / SHOWERING ° °Keep the surgical dressing until follow up.  The dressing is water proof, so you can shower without any extra covering.  IF THE DRESSING FALLS OFF or the wound gets wet inside, change the dressing with sterile gauze.    Please use good hand washing techniques before changing the dressing.  Do not use any lotions or creams on the incision until instructed by your surgeon.   ° °ACTIVITY ° °o Increase activity slowly as tolerated, but follow the weight bearing instructions below.   °o No driving for 6 weeks or until further direction given by your physician.  You cannot drive while taking narcotics.  °o No lifting or carrying greater than 10 lbs.  until further directed by your surgeon. °o Avoid periods of inactivity such as sitting longer than an hour when not asleep. This helps prevent blood clots.  °o You may return to work once you are authorized by your doctor.  ° ° ° °WEIGHT BEARING  ° °Weight bearing as tolerated with assist device (walker, cane, etc) as directed, use it as long as suggested by your surgeon or therapist, typically at least 4-6 weeks. ° ° °EXERCISES ° °Results after joint replacement surgery are often greatly improved when you follow the exercise, range of motion and muscle strengthening exercises prescribed by your doctor. Safety measures are also important to protect the joint from further injury. Any time any of these exercises cause you to have increased pain or swelling, decrease what you are doing until you are comfortable again and then slowly increase them. If you have problems or questions, call your caregiver or physical therapist for advice.  ° °Rehabilitation is important following a joint replacement. After just a few days of immobilization, the muscles of the leg can become weakened and shrink (atrophy).  These exercises are designed to build up the tone and strength of the thigh and leg muscles and to improve motion. Often times heat used for twenty to thirty minutes before working out will loosen up your tissues and help with improving the range of motion but do not use heat for the first two weeks following surgery (sometimes heat can increase post-operative swelling).  ° °These exercises can be done on a training (exercise) mat, on the floor, on a table or on a bed. Use whatever works the best and is most comfortable for you.    Use music or television while you are exercising so that the exercises are a pleasant break in your day. This will make your life better with the exercises acting as a break in your routine that you can look forward to.   Perform all exercises about fifteen times, three times per day or as  directed.  You should exercise both the operative leg and the other leg as well. ° °Exercises include: °  °• Quad Sets - Tighten up the muscle on the front of the thigh (Quad) and hold for 5-10 seconds.   °• Straight Leg Raises - With your knee straight (if you were given a brace, keep it on), lift the leg to 60 degrees, hold for 3 seconds, and slowly lower the leg.  Perform this exercise against resistance later as your leg gets stronger.  °• Leg Slides: Lying on your back, slowly slide your foot toward your buttocks, bending your knee up off the floor (only go as far as is comfortable). Then slowly slide your foot back down until your leg is flat on the floor again.  °• Angel Wings: Lying on your back spread your legs to the side as far apart as you can without causing discomfort.  °• Hamstring Strength:  Lying on your back, push your heel against the floor with your leg straight by tightening up the   muscles of your buttocks.  Repeat, but this time bend your knee to a comfortable angle, and push your heel against the floor.  You may put a pillow under the heel to make it more comfortable if necessary.  ° °A rehabilitation program following joint replacement surgery can speed recovery and prevent re-injury in the future due to weakened muscles. Contact your doctor or a physical therapist for more information on knee rehabilitation.  ° ° °CONSTIPATION ° °Constipation is defined medically as fewer than three stools per week and severe constipation as less than one stool per week.  Even if you have a regular bowel pattern at home, your normal regimen is likely to be disrupted due to multiple reasons following surgery.  Combination of anesthesia, postoperative narcotics, change in appetite and fluid intake all can affect your bowels.  ° °YOU MUST use at least one of the following options; they are listed in order of increasing strength to get the job done.  They are all available over the counter, and you may need to  use some, POSSIBLY even all of these options:   ° °Drink plenty of fluids (prune juice may be helpful) and high fiber foods °Colace 100 mg by mouth twice a day  °Senokot for constipation as directed and as needed Dulcolax (bisacodyl), take with full glass of water  °Miralax (polyethylene glycol) once or twice a day as needed. ° °If you have tried all these things and are unable to have a bowel movement in the first 3-4 days after surgery call either your surgeon or your primary doctor.   ° °If you experience loose stools or diarrhea, hold the medications until you stool forms back up.  If your symptoms do not get better within 1 week or if they get worse, check with your doctor.  If you experience "the worst abdominal pain ever" or develop nausea or vomiting, please contact the office immediately for further recommendations for treatment. ° ° °ITCHING:  If you experience itching with your medications, try taking only a single pain pill, or even half a pain pill at a time.  You can also use Benadryl over the counter for itching or also to help with sleep.  ° °TED HOSE STOCKINGS:  Use stockings on both legs until for at least 2 weeks or as directed by physician office. They may be removed at night for sleeping. ° °MEDICATIONS:  See your medication summary on the “After Visit Summary” that nursing will review with you.  You may have some home medications which will be placed on hold until you complete the course of blood thinner medication.  It is important for you to complete the blood thinner medication as prescribed. ° °PRECAUTIONS:  If you experience chest pain or shortness of breath - call 911 immediately for transfer to the hospital emergency department.  ° °If you develop a fever greater that 101 F, purulent drainage from wound, increased redness or drainage from wound, foul odor from the wound/dressing, or calf pain - CONTACT YOUR SURGEON.   °                                                °FOLLOW-UP  APPOINTMENTS:  If you do not already have a post-op appointment, please call the office for an appointment to be seen by your surgeon.  Guidelines for how   soon to be seen are listed in your “After Visit Summary”, but are typically between 1-4 weeks after surgery. ° °OTHER INSTRUCTIONS:  ° °Knee Replacement:  Do not place pillow under knee, focus on keeping the knee straight while resting. CPM instructions: 0-90 degrees, 2 hours in the morning, 2 hours in the afternoon, and 2 hours in the evening. Place foam block, curve side up under heel at all times except when in CPM or when walking.  DO NOT modify, tear, cut, or change the foam block in any way. ° °MAKE SURE YOU:  °• Understand these instructions.  °• Get help right away if you are not doing well or get worse.  ° ° °Thank you for letting us be a part of your medical care team.  It is a privilege we respect greatly.  We hope these instructions will help you stay on track for a fast and full recovery!  ° °

## 2016-02-01 NOTE — Progress Notes (Signed)
RN reviewed discharge instructions with patient and family. All questions answered.   Paperwork and prescriptions given.   RN rolled patient down in wheelchair to family car.

## 2016-02-01 NOTE — Progress Notes (Signed)
Physical Therapy Treatment Patient Details Name: Patricia [name] MRN: [id number] DOB: 09/03/1961 Today's Date: 02/01/2016    History of Present Illness L TKR    PT Comments    POD # 2 am session.  Applied KI and instructed on use for amb and stairs until able to perform active SLR.  Assisted OOB to amb a greater distance in hallway.  Practiced 2 steps using one rail and one crutch.    Follow Up Recommendations  Home health PT     Equipment Recommendations  None recommended by PT    Recommendations for Other Services       Precautions / Restrictions Precautions Precautions: Knee;Fall Precaution Comments: instructed to wear KI for amb and esp stairs Required Braces or Orthoses: Knee Immobilizer - Left Knee Immobilizer - Left: Discontinue once straight leg raise with < 10 degree lag Restrictions Weight Bearing Restrictions: No Other Position/Activity Restrictions: WBAT    Mobility  Bed Mobility Overal bed mobility: Needs Assistance Bed Mobility: Supine to Sit     Supine to sit: Min assist     General bed mobility comments: assist L LE off bed  Transfers Overall transfer level: Needs assistance Equipment used: Rolling walker (2 wheeled) Transfers: Sit to/from Stand Sit to Stand: Supervision;Min guard         General transfer comment: 25% VC's on proper tech and hand placement  Ambulation/Gait Ambulation/Gait assistance: Min guard Ambulation Distance (Feet): 80 Feet Assistive device: Rolling walker (2 wheeled) Gait Pattern/deviations: Step-to pattern;Decreased step length - left;Decreased step length - right;Decreased stance time - left Gait velocity: decr   General Gait Details: cues for sequence, posture, stride length and position from RW   Stairs Stairs: Yes Stairs assistance: Min guard Stair Management: One rail Right;Step to pattern;Forwards;With crutches Number of Stairs: 2 General stair comments: 25% VC's on proper tech and  safety  Wheelchair Mobility    Modified Rankin (Stroke Patients Only)       Balance                                    Cognition Arousal/Alertness: Awake/alert Behavior During Therapy: WFL for tasks assessed/performed Overall Cognitive Status: Within Functional Limits for tasks assessed                      Exercises      General Comments        Pertinent Vitals/Pain Pain Assessment: 0-10 Pain Score: 5  Pain Location: L knee Pain Descriptors / Indicators: Sore;Tender Pain Intervention(s): Monitored during session;Premedicated before session;Repositioned;Ice applied    Home Living                      Prior Function            PT Goals (current goals can now be found in the care plan section) Progress towards PT goals: Progressing toward goals    Frequency  7X/week    PT Plan Current plan remains appropriate    Co-evaluation             End of Session Equipment Utilized During Treatment: Left knee immobilizer;Gait belt Activity Tolerance: Patient tolerated treatment well Patient left: in chair;with call bell/phone within reach;with family/visitor present     Time: 0925-0955 PT Time Calculation (min) (ACUTE ONLY): 30 min  Charges:  $Gait Training: 8-22 mins $Therapeutic Activity: 8-22 mins  G Codes:      Rica Koyanagi  PTA [facility]  Acute  Rehab Pager      [phone]

## 2016-02-01 NOTE — Discharge Summary (Signed)
Patient ID: Patricia [name] MRN: [id number] DOB/AGE: 01/06/1961 55 y.o.  Admit date: 01/30/2016 Discharge date: 02/01/2016  Admission Diagnoses:  Principal Problem:   Osteoarthritis of left knee Active Problems:   Status post total left knee replacement   Discharge Diagnoses:  Same  Past Medical History  Diagnosis Date  . Anxiety   . Dysplasia of cervix, low grade (CIN 1) 1987    cryo  . Hyperlipemia   . Arthritis     oa  . Loose body in joint     right knee  . Complication of anesthesia     does not like needles, has passsed out with needle insertion    Surgeries: Procedure(s): LEFT TOTAL KNEE ARTHROPLASTY on 01/30/2016   Consultants:    Discharged Condition: Improved  Hospital Course: Patricia [name] is an 55 y.o. female who was admitted 01/30/2016 for operative treatment ofOsteoarthritis of left knee. Patient has severe unremitting pain that affects sleep, daily activities, and work/hobbies. After pre-op clearance the patient was taken to the operating room on 01/30/2016 and underwent  Procedure(s): LEFT TOTAL KNEE ARTHROPLASTY.    Patient was given perioperative antibiotics: Anti-infectives    Start     Dose/Rate Route Frequency Ordered Stop   01/30/16 1230  ceFAZolin (ANCEF) IVPB 1 g/50 mL premix     1 g 100 mL/hr over 30 Minutes Intravenous Every 6 hours 01/30/16 1159 01/30/16 1853   01/30/16 0536  ceFAZolin (ANCEF) IVPB 2 g/50 mL premix     2 g 100 mL/hr over 30 Minutes Intravenous On call to O.R. 01/30/16 4098 01/30/16 0731       Patient was given sequential compression devices, early ambulation, and chemoprophylaxis to prevent DVT.  Patient benefited maximally from hospital stay and there were no complications.    Recent vital signs: Patient Vitals for the past 24 hrs:  BP Temp Temp src Pulse Resp SpO2  02/01/16 0414 (!) 122/53 mmHg 99.4 F (37.4 C) Oral 95 16 95 %  02/01/16 0355 - 99.4 F (37.4 C) Oral - - -  01/31/16 2358 - 100 F (37.8  C) Oral - - -  01/31/16 2255 (!) 124/55 mmHg (!) 100.4 F (38 C) Oral (!) 115 16 97 %  01/31/16 1350 (!) 120/58 mmHg 98 F (36.7 C) Oral 78 15 98 %     Recent laboratory studies:  Recent Labs  01/31/16 0436  WBC 12.3*  HGB 11.1*  HCT 33.6*  PLT 222  NA 138  K 4.3  CL 103  CO2 28  BUN 13  CREATININE 0.72  GLUCOSE 112*  CALCIUM 9.1     Discharge Medications:     Medication List    STOP taking these medications        ADVIL 200 MG tablet  Generic drug:  ibuprofen     GLUCOSAMINE PO     naproxen sodium 220 MG tablet  Commonly known as:  ANAPROX      TAKE these medications        B-complex with vitamin C tablet  Take 1 tablet by mouth daily.     CALCIUM + D PO  Take 1 tablet by mouth every morning.     cetirizine 10 MG tablet  Commonly known as:  ZYRTEC  Take 10 mg by mouth every morning.     cyclobenzaprine 10 MG tablet  Commonly known as:  FLEXERIL  Take 1 tablet (10 mg total) by mouth 3 (three) times daily as needed for muscle spasms.  diazepam 5 MG tablet  Commonly known as:  VALIUM  Take 5 mg by mouth once.     DRY EYES OP  Apply 1 drop to eye every morning.     EXCEDRIN BACK & BODY PO  Take 1 tablet by mouth daily as needed (pain).     oxyCODONE 5 MG immediate release tablet  Commonly known as:  Oxy IR/ROXICODONE  Take 1-3 tablets (5-15 mg total) by mouth every 3 (three) hours as needed for breakthrough pain.     PARoxetine 10 MG tablet  Commonly known as:  PAXIL  Take 1 tablet (10 mg total) by mouth every morning.     rivaroxaban 10 MG Tabs tablet  Commonly known as:  XARELTO  Take 1 tablet (10 mg total) by mouth daily with breakfast.     TYLENOL ARTHRITIS PAIN PO  Take 650 mg by mouth every morning.     VITAMIN D PO  Take 1,000 Int'l Units by mouth every morning.        Diagnostic Studies: Dg Knee Left Port  01/30/2016  CLINICAL DATA:  Status post left total knee replacement. History of joint disease. EXAM: PORTABLE LEFT  KNEE - 1-2 VIEW COMPARISON:  None. FINDINGS: There is a total knee arthroplasty. The left knee is located. Negative for a periprosthetic fracture. Expected lucency in the soft tissues from recent surgery. IMPRESSION: Left total knee arthroplasty with expected postsurgical changes. Electronically Signed   By: Richarda Overlie M.D.   On: 01/30/2016 10:11    Disposition: to home      Discharge Instructions    Discharge patient    Complete by:  As directed            Follow-up Information    Follow up with Kathryne Hitch, MD In 2 weeks.   Specialty:  Orthopedic Surgery   Contact information:   [address] [place] [address] [phone]        Signed: Kathryne Hitch 02/01/2016, 10:12 AM

## 2016-02-01 NOTE — Progress Notes (Signed)
Subjective: 2 Days Post-Op Procedure(s) (LRB): LEFT TOTAL KNEE ARTHROPLASTY (Left) Patient reports pain as moderate.    Objective: Vital signs in last 24 hours: Temp:  [98 F (36.7 C)-100.4 F (38 C)] 99.4 F (37.4 C) (02/19 0414) Pulse Rate:  [78-115] 95 (02/19 0414) Resp:  [15-16] 16 (02/19 0414) BP: (120-124)/(53-58) 122/53 mmHg (02/19 0414) SpO2:  [95 %-98 %] 95 % (02/19 0414)  Intake/Output from previous day: 02/18 0701 - 02/19 0700 In: 1440 [P.O.:1440] Out: 850 [Urine:850] Intake/Output this shift: Total I/O In: 480 [P.O.:480] Out: 300 [Urine:300]   Recent Labs  01/31/16 0436  HGB 11.1*    Recent Labs  01/31/16 0436  WBC 12.3*  RBC 3.67*  HCT 33.6*  PLT 222    Recent Labs  01/31/16 0436  NA 138  K 4.3  CL 103  CO2 28  BUN 13  CREATININE 0.72  GLUCOSE 112*  CALCIUM 9.1   No results for input(s): LABPT, INR in the last 72 hours.  Sensation intact distally Intact pulses distally Dorsiflexion/Plantar flexion intact Incision: no drainage No cellulitis present Compartment soft  Assessment/Plan: 2 Days Post-Op Procedure(s) (LRB): LEFT TOTAL KNEE ARTHROPLASTY (Left) Up with therapy Discharge home with home health today.  Jillayne Witte Y 02/01/2016, 10:08 AM

## 2016-02-01 NOTE — Progress Notes (Signed)
Physical Therapy Treatment Patient Details Name: Patricia [name] MRN: [id number] DOB: 1961-01-17 Today's Date: 02/01/2016    History of Present Illness L TKR    PT Comments    POD # 2 pm session Assisted OOB to practiced stairs.  First scenario was B crutched forward  4 steps.  Second scenario was one crutch and one rail.  Handouts given and instructed on safety and equipment use.  Also given HEP and instructed on freq plus the use of ICE after.   Pt ready for D/C to home.    Follow Up Recommendations  Home health PT     Equipment Recommendations  None recommended by PT    Recommendations for Other Services       Precautions / Restrictions Precautions Precautions: Knee;Fall Precaution Comments: instructed to wear KI for amb and esp stairs Required Braces or Orthoses: Knee Immobilizer - Left Knee Immobilizer - Left: Discontinue once straight leg raise with < 10 degree lag Restrictions Weight Bearing Restrictions: No Other Position/Activity Restrictions: WBAT    Mobility  Bed Mobility Overal bed mobility: Needs Assistance Bed Mobility: Supine to Sit     Supine to sit: Min assist     General bed mobility comments: assist L LE off bed  Transfers Overall transfer level: Needs assistance Equipment used: Rolling walker (2 wheeled) Transfers: Sit to/from Stand Sit to Stand: Supervision;Min guard         General transfer comment: 25% VC's on proper tech and hand placement  Ambulation/Gait Ambulation/Gait assistance: Min guard Ambulation Distance (Feet): 80 Feet Assistive device: Rolling walker (2 wheeled) Gait Pattern/deviations: Step-to pattern;Decreased step length - left;Decreased step length - right;Decreased stance time - left Gait velocity: decr   General Gait Details: cues for sequence, posture, stride length and position from RW   Stairs Stairs: Yes Stairs assistance: Min guard Stair Management: One rail Right;Step to pattern;Forwards;With  crutches Number of Stairs: 4 General stair comments: 25% VC's on proper tech and safety Stairs MinGuard B crutches due to no rails when entering home.   Wheelchair Mobility    Modified Rankin (Stroke Patients Only)       Balance                                    Cognition Arousal/Alertness: Awake/alert Behavior During Therapy: WFL for tasks assessed/performed Overall Cognitive Status: Within Functional Limits for tasks assessed                      Exercises      General Comments        Pertinent Vitals/Pain Pain Assessment: 0-10 Pain Score: 5  Pain Location: L knee Pain Descriptors / Indicators: Sore;Tender Pain Intervention(s): Monitored during session;Premedicated before session;Repositioned;Ice applied    Home Living                      Prior Function            PT Goals (current goals can now be found in the care plan section) Progress towards PT goals: Progressing toward goals    Frequency  7X/week    PT Plan Current plan remains appropriate    Co-evaluation             End of Session Equipment Utilized During Treatment: Left knee immobilizer;Gait belt Activity Tolerance: Patient tolerated treatment well Patient left: in chair;with call bell/phone within reach;with  family/visitor present     Time: 1240-1310 PT Time Calculation (min) (ACUTE ONLY): 30 min  Charges:  $Gait Training: 8-22 mins $Therapeutic Activity: 8-22 mins                    G Codes:      Felecia Shelling  PTA [facility]  Acute  Rehab Pager      [phone]

## 2016-02-01 NOTE — Progress Notes (Signed)
NCM contacted [facility] for 3n1 for home. Isidoro Donning RN CCM Case Mgmt phone [phone]

## 2016-02-01 NOTE — Progress Notes (Signed)
Occupational Therapy Treatment Patient Details Name: Patricia [name] MRN: [id number] DOB: 1961/09/18 Today's Date: 02/01/2016    History of present illness L TKR   OT comments  OT education complete  Follow Up Recommendations  No OT follow up    Equipment Recommendations  3 in 1 bedside comode    Recommendations for Other Services      Precautions / Restrictions Precautions Precautions: Knee;Fall Required Braces or Orthoses: Knee Immobilizer - Left Knee Immobilizer - Left: Discontinue once straight leg raise with < 10 degree lag Restrictions Weight Bearing Restrictions: No Other Position/Activity Restrictions: WBAT       Mobility Bed Mobility               General bed mobility comments: pt in chair  Transfers Overall transfer level: Needs assistance Equipment used: Rolling walker (2 wheeled) Transfers: Sit to/from Stand Sit to Stand: Min guard         General transfer comment: cues for hand placement and LE management.    Balance                                   ADL Overall ADL's : Needs assistance/impaired                     Lower Body Dressing: Minimal assistance;Cueing for sequencing;Cueing for safety;With adaptive equipment;Sit to/from stand   Toilet Transfer: Supervision/safety;Comfort height toilet;Ambulation;RW   Toileting- Clothing Manipulation and Hygiene: Supervision/safety;Sit to/from stand   Tub/ Shower Transfer: Min guard;Cueing for safety;Cueing for sequencing                      Cognition   Behavior During Therapy: [facility] for tasks assessed/performed Overall Cognitive Status: Within Functional Limits for tasks assessed                                    Pertinent Vitals/ Pain       Pain Score: 7  Pain Descriptors / Indicators: Sore Pain Intervention(s): Repositioned;Ice applied;Monitored during session;Limited activity within patient's tolerance         Frequency Min 2X/week      Progress Toward Goals  OT Goals(current goals can now be found in the care plan section)  Progress towards OT goals: Progressing toward goals     Plan         End of Session Equipment Utilized During Treatment: Rolling walker;Left knee immobilizer CPM Left Knee CPM Left Knee: Off   Activity Tolerance Patient limited by pain   Patient Left in bed;with call bell/phone within reach;with family/visitor present   Nurse Communication          Time: 7829-5621 OT Time Calculation (min): 35 min  Charges: OT General Charges $OT Visit: 1 Procedure OT Treatments $Self Care/Home Management : 23-37 mins  Brandt Chaney, [place]ung 02/01/2016, 11:01 AM

## 2016-02-12 ENCOUNTER — Emergency Department (HOSPITAL_COMMUNITY): Payer: BLUE CROSS/BLUE SHIELD

## 2016-02-12 ENCOUNTER — Encounter (HOSPITAL_COMMUNITY): Payer: Self-pay | Admitting: Emergency Medicine

## 2016-02-12 ENCOUNTER — Observation Stay (HOSPITAL_COMMUNITY)
Admission: EM | Admit: 2016-02-12 | Discharge: 2016-02-15 | Disposition: A | Discharge status: Home / Self Care | Payer: BLUE CROSS/BLUE SHIELD | Attending: Internal Medicine | Admitting: Internal Medicine

## 2016-02-12 DIAGNOSIS — R42 Dizziness and giddiness: Secondary | ICD-10-CM | POA: Diagnosis not present

## 2016-02-12 DIAGNOSIS — R0600 Dyspnea, unspecified: Secondary | ICD-10-CM | POA: Insufficient documentation

## 2016-02-12 DIAGNOSIS — Z7901 Long term (current) use of anticoagulants: Secondary | ICD-10-CM | POA: Insufficient documentation

## 2016-02-12 DIAGNOSIS — Z9889 Other specified postprocedural states: Secondary | ICD-10-CM | POA: Diagnosis not present

## 2016-02-12 DIAGNOSIS — M1990 Unspecified osteoarthritis, unspecified site: Secondary | ICD-10-CM | POA: Insufficient documentation

## 2016-02-12 DIAGNOSIS — R0609 Other forms of dyspnea: Secondary | ICD-10-CM | POA: Diagnosis not present

## 2016-02-12 DIAGNOSIS — Z79891 Long term (current) use of opiate analgesic: Secondary | ICD-10-CM | POA: Insufficient documentation

## 2016-02-12 DIAGNOSIS — Z79899 Other long term (current) drug therapy: Secondary | ICD-10-CM | POA: Diagnosis not present

## 2016-02-12 DIAGNOSIS — Z96652 Presence of left artificial knee joint: Secondary | ICD-10-CM | POA: Diagnosis not present

## 2016-02-12 DIAGNOSIS — E785 Hyperlipidemia, unspecified: Secondary | ICD-10-CM | POA: Diagnosis not present

## 2016-02-12 DIAGNOSIS — Z96698 Presence of other orthopedic joint implants: Secondary | ICD-10-CM | POA: Insufficient documentation

## 2016-02-12 DIAGNOSIS — Z791 Long term (current) use of non-steroidal anti-inflammatories (NSAID): Secondary | ICD-10-CM | POA: Diagnosis not present

## 2016-02-12 DIAGNOSIS — R0902 Hypoxemia: Secondary | ICD-10-CM | POA: Diagnosis not present

## 2016-02-12 DIAGNOSIS — R000 Tachycardia, unspecified: Secondary | ICD-10-CM | POA: Diagnosis not present

## 2016-02-12 DIAGNOSIS — F419 Anxiety disorder, unspecified: Secondary | ICD-10-CM | POA: Diagnosis not present

## 2016-02-12 LAB — CBC
HCT: 34.7 % — ABNORMAL LOW (ref 36.0–46.0)
Hemoglobin: 11.4 g/dL — ABNORMAL LOW (ref 12.0–15.0)
MCH: 29 pg (ref 26.0–34.0)
MCHC: 32.9 g/dL (ref 30.0–36.0)
MCV: 88.3 fL (ref 78.0–100.0)
PLATELETS: 463 10*3/uL — AB (ref 150–400)
RBC: 3.93 MIL/uL (ref 3.87–5.11)
RDW: 12.3 % (ref 11.5–15.5)
WBC: 11.9 10*3/uL — ABNORMAL HIGH (ref 4.0–10.5)

## 2016-02-12 LAB — BASIC METABOLIC PANEL
ANION GAP: 10 (ref 5–15)
BUN: 27 mg/dL — ABNORMAL HIGH (ref 6–20)
CALCIUM: 9.7 mg/dL (ref 8.9–10.3)
CHLORIDE: 103 mmol/L (ref 101–111)
CO2: 24 mmol/L (ref 22–32)
Creatinine, Ser: 0.76 mg/dL (ref 0.44–1.00)
GFR calc Af Amer: >60 mL/min (ref >60)
GFR calc non Af Amer: >60 mL/min (ref >60)
GLUCOSE: 116 mg/dL — AB (ref 65–99)
POTASSIUM: 3.9 mmol/L (ref 3.5–5.1)
Sodium: 137 mmol/L (ref 135–145)

## 2016-02-12 LAB — TROPONIN I: Troponin I: <0.03 ng/mL (ref <0.031)

## 2016-02-12 MED ORDER — ACETAMINOPHEN 650 MG RE SUPP: 650.0000 mg | Freq: Four times a day (QID) | RECTAL | Status: DC | PRN

## 2016-02-12 MED ORDER — HYDROMORPHONE HCL 1 MG/ML IJ SOLN
1.0000 mg | Freq: Once | INTRAMUSCULAR | Status: AC
  Administered 2016-02-12: 1 mg via INTRAVENOUS
  Filled 2016-02-12: qty 1

## 2016-02-12 MED ORDER — IOHEXOL 350 MG/ML SOLN
100.0000 mL | Freq: Once | INTRAVENOUS | Status: AC | PRN
  Administered 2016-02-12: 100 mL via INTRAVENOUS

## 2016-02-12 MED ORDER — HYDROMORPHONE HCL 2 MG PO TABS
2.0000 mg | ORAL_TABLET | ORAL | Status: DC | PRN
  Administered 2016-02-12 – 2016-02-15 (×11): 2 mg via ORAL
  Administered 2016-02-15: 4 mg via ORAL
  Administered 2016-02-15 (×2): 2 mg via ORAL
  Filled 2016-02-12 (×15): qty 1

## 2016-02-12 MED ORDER — RIVAROXABAN 10 MG PO TABS
10.0000 mg | ORAL_TABLET | Freq: Every day | ORAL | Status: DC
  Administered 2016-02-13 – 2016-02-15 (×3): 10 mg via ORAL
  Filled 2016-02-12 (×4): qty 1

## 2016-02-12 MED ORDER — ONDANSETRON HCL 4 MG PO TABS: 4.0000 mg | ORAL_TABLET | Freq: Four times a day (QID) | ORAL | Status: DC | PRN

## 2016-02-12 MED ORDER — CYCLOBENZAPRINE HCL 10 MG PO TABS
10.0000 mg | ORAL_TABLET | Freq: Three times a day (TID) | ORAL | Status: DC | PRN
  Administered 2016-02-15: 10 mg via ORAL
  Filled 2016-02-12: qty 1

## 2016-02-12 MED ORDER — B COMPLEX-C PO TABS
1.0000 | ORAL_TABLET | Freq: Every day | ORAL | Status: DC
  Administered 2016-02-13 – 2016-02-15 (×3): 1 via ORAL
  Filled 2016-02-12 (×3): qty 1

## 2016-02-12 MED ORDER — ACETAMINOPHEN 325 MG PO TABS
650.0000 mg | ORAL_TABLET | Freq: Every morning | ORAL | Status: DC
  Administered 2016-02-13 – 2016-02-15 (×3): 650 mg via ORAL
  Filled 2016-02-12 (×4): qty 2

## 2016-02-12 MED ORDER — ACETAMINOPHEN 325 MG PO TABS: 650.0000 mg | ORAL_TABLET | Freq: Four times a day (QID) | ORAL | Status: DC | PRN

## 2016-02-12 MED ORDER — HYDROMORPHONE HCL 2 MG PO TABS: 2.0000 mg | ORAL_TABLET | Freq: Once | ORAL | Status: DC

## 2016-02-12 MED ORDER — LORATADINE 10 MG PO TABS
10.0000 mg | ORAL_TABLET | Freq: Every day | ORAL | Status: DC
  Administered 2016-02-13 – 2016-02-15 (×3): 10 mg via ORAL
  Filled 2016-02-12 (×3): qty 1

## 2016-02-12 MED ORDER — PAROXETINE HCL 20 MG PO TABS
10.0000 mg | ORAL_TABLET | ORAL | Status: DC
  Administered 2016-02-13 – 2016-02-15 (×3): 10 mg via ORAL
  Filled 2016-02-12 (×3): qty 1

## 2016-02-12 MED ORDER — ONDANSETRON HCL 4 MG/2ML IJ SOLN: 4.0000 mg | Freq: Four times a day (QID) | INTRAMUSCULAR | Status: DC | PRN

## 2016-02-12 NOTE — ED Provider Notes (Signed)
CSN: [id number]     Arrival date & time 02/12/16  1653 History   First MD Initiated Contact with Patient 02/12/16 1731     Chief Complaint  Patient presents with  . Shortness of Breath     (Consider location/radiation/quality/duration/timing/severity/associated sxs/prior Treatment) HPI Patient is status post left knee replacement 01/30/2016. She complains of shortness of breath onset last night. Dyspnea is worse with exertion and improved with rest. She denies chest pain denies fever denies cough. No other associated symptoms. No treatment prior to coming here. She was seen by Dr. Magnus Ivan today who is concerned that patient may have pulmonary embolism. Sent here for further evaluation. No treatment prior to coming here. Past Medical History  Diagnosis Date  . Anxiety   . Dysplasia of cervix, low grade (CIN 1) 1987    cryo  . Hyperlipemia   . Arthritis     oa  . Loose body in joint     right knee  . Complication of anesthesia     does not like needles, has passsed out with needle insertion   Past Surgical History  Procedure Laterality Date  . Liposuction    . Knee surgery Left 12-12-14    lateral release and meniscus repair  . Eye surgery Bilateral 01-13-2005  . Uterine ablation  07-14-2007  . Augmentation mammaplasty      2009 ruptured silicone implants, replaced with saline 2009  . Joint replacement  12-13-2009    toe joint left big toe  . Total knee arthroplasty Left 01/30/2016    Procedure: LEFT TOTAL KNEE ARTHROPLASTY;  Surgeon: Kathryne Hitch, MD;  Location: [facility];  Service: Orthopedics;  Laterality: Left;   Family History  Problem Relation Age of Onset  . Hypertension Mother 60  . Alzheimer's disease Mother   . Lymphoma Father   . Cancer Father     brain tumor   Social History  Substance Use Topics  . Smoking status: Never Smoker   . Smokeless tobacco: Never Used  . Alcohol Use: 1.2 - 2.4 oz/week    2-4 Standard drinks or equivalent per week     Comment:  occasional wine   OB History    Gravida Para Term Preterm AB TAB SAB Ectopic Multiple Living   Review of Systems  Constitutional: Negative.   HENT: Negative.   Respiratory: Positive for shortness of breath.   Cardiovascular: Negative.   Gastrointestinal: Negative.   Musculoskeletal: Positive for arthralgias.       Left knee pain  Skin: Negative.   Neurological: Negative.   Psychiatric/Behavioral: Negative.   All other systems reviewed and are negative.     Allergies  Review of patient's allergies indicates no known allergies.  Home Medications   Prior to Admission medications   Medication Sig Start Date End Date Taking? Authorizing Provider  Acetaminophen (TYLENOL ARTHRITIS PAIN PO) Take 650 mg by mouth every morning.   Yes Historical Provider, MD  Acetaminophen-Aspirin Buffered (EXCEDRIN BACK & BODY PO) Take 1 tablet by mouth daily as needed (pain).   Yes Historical Provider, MD  Artificial Tear Ointment (DRY EYES OP) Apply 1 drop to eye every morning.   Yes Historical Provider, MD  B Complex-C (B-COMPLEX WITH VITAMIN C) tablet Take 1 tablet by mouth daily.   Yes Historical Provider, MD  [id number] Carbonate-Vitamin D (CALCIUM + D PO) Take 1 tablet by mouth every morning.    Yes  Historical Provider, MD  cetirizine (ZYRTEC) 10 MG tablet Take 10 mg by mouth every morning.    Yes Historical Provider, MD  Cholecalciferol (VITAMIN D PO) Take 1,000 Int'l Units by mouth every morning.    Yes Historical Provider, MD  cyclobenzaprine (FLEXERIL) 10 MG tablet Take 1 tablet (10 mg total) by mouth 3 (three) times daily as needed for muscle spasms. 02/01/16  Yes Kathryne Hitch, MD  HYDROmorphone (DILAUDID) 2 MG tablet Take 2-4 mg by mouth every 4 (four) hours as needed for moderate pain or severe pain.  02/05/16  Yes Historical Provider, MD  PARoxetine (PAXIL) 10 MG tablet Take 1 tablet (10 mg total) by mouth every morning. 07/26/14  Yes Brook Rosalin Hawking, MD   rivaroxaban (XARELTO) 10 MG TABS tablet Take 1 tablet (10 mg total) by mouth daily with breakfast. 02/01/16  Yes Kathryne Hitch, MD  oxyCODONE (OXY IR/ROXICODONE) 5 MG immediate release tablet Take 1-3 tablets (5-15 mg total) by mouth every 3 (three) hours as needed for breakthrough pain. Patient not taking: Reported on 02/12/2016 02/01/16   Kathryne Hitch, MD   BP 160/140 mmHg  Pulse 113  Resp 16  SpO2 100% Physical Exam  Constitutional: She appears well-developed and well-nourished. No distress.  HENT:  Head: Normocephalic and atraumatic.  Eyes: Conjunctivae are normal. Pupils are equal, round, and reactive to light.  Neck: Neck supple. No JVD present. No tracheal deviation present. No thyromegaly present.  No bruit  Cardiovascular: Normal rate, regular rhythm and normal heart sounds.   No murmur heard. Pulmonary/Chest: Effort normal and breath sounds normal.  Abdominal: Soft. Bowel sounds are normal. She exhibits no distension. There is no tenderness.  Musculoskeletal: Normal range of motion. She exhibits no edema or tenderness.  Left lower extremity with Steri-Stripped wound overlying anterior knee. Wound is clean appearing. DP pulse 2+ PT pulse 2+.. No calf tenderness noted by tenderness. All other extremities without redness or tenderness neurovascular intact  Neurological: She is alert. Coordination normal.  Skin: Skin is warm and dry. No rash noted.  Psychiatric: She has a normal mood and affect.  Nursing note and vitals reviewed.   ED Course  Procedures (including critical care time) Labs Review Labs Reviewed - No data to display  Imaging Review No results found. I have personally reviewed and evaluated these images and lab results as part of my medical decision-making.   EKG Interpretation   Date/Time:  Thursday February 12 2016 17:08:57 EST Ventricular Rate:  115 PR Interval:  176 QRS Duration: 87 QT Interval:  314 QTC Calculation: 434 R Axis:    78 Text Interpretation:  Sinus tachycardia Minimal ST elevation, inferior  leads No old tracing to compare Confirmed by Ethelda Chick  MD, Lorry Anastasi [phone])  on 02/12/2016 6:02:44 PM      Chest x-ray viewed by me  7:30 PM at left knee is improved after treatment with intravenous hydromorphone :35 PM upon getting patient up to ambulate she became lightheaded. Pulse increased to 122. Pulse oximetry decreased to 87% on room air. She became "flushed" per the EMT Results for orders placed or performed during the hospital encounter of 02/12/16  CBC  Result Value Ref Range   WBC 11.9 (H) 4.0 - 10.5 K/uL   RBC 3.93 3.87 - 5.11 MIL/uL   Hemoglobin 11.4 (L) 12.0 - 15.0 g/dL   HCT 81.1 (L) 91.4 - 78.2 %   MCV 88.3 78.0 - 100.0 fL   MCH 29.0 26.0 - 34.0  pg   MCHC 32.9 30.0 - 36.0 g/dL   RDW 78.2 95.6 - 21.3 %   Platelets 463 (H) 150 - 400 K/uL  Troponin I  Result Value Ref Range   Troponin I <0.03 <0.031 ng/mL  Basic metabolic panel  Result Value Ref Range   Sodium 137 135 - 145 mmol/L   Potassium 3.9 3.5 - 5.1 mmol/L   Chloride 103 101 - 111 mmol/L   CO2 24 22 - 32 mmol/L   Glucose, Bld 116 (H) 65 - 99 mg/dL   BUN 27 (H) 6 - 20 mg/dL   Creatinine, Ser 0.86 0.44 - 1.00 mg/dL   Calcium 9.7 8.9 - 57.8 mg/dL   GFR calc non Af Amer >60 >60 mL/min   GFR calc Af Amer >60 >60 mL/min   Anion gap 10 5 - 15   Dg Chest 2 View  02/12/2016  CLINICAL DATA:  Shortness of breath.  Knee surgery on 01/30/2016 EXAM: CHEST  2 VIEW COMPARISON:  None. FINDINGS: The heart size and mediastinal contours are within normal limits. Both lungs are clear. The visualized skeletal structures are unremarkable. IMPRESSION: No active cardiopulmonary disease. Electronically Signed   By: Gaylyn Rong M.D.   On: 02/12/2016 17:33   Ct Angio Chest Pe W/cm &/or Wo Cm  02/12/2016  CLINICAL DATA:  Pt c/o SOB, dizziness on exertion or standing sudden onset today. Pt had left TKR 01/30/16. On xarelto. Sent by PCP to r/o PE. EXAM: CT  ANGIOGRAPHY CHEST WITH CONTRAST TECHNIQUE: Multidetector CT imaging of the chest was performed using the standard protocol during bolus administration of intravenous contrast. Multiplanar CT image reconstructions and MIPs were obtained to evaluate the vascular anatomy. CONTRAST:  OMNIPAQUE IOHEXOL 350 MG/ML SOLN COMPARISON:  Current chest radiographs. FINDINGS: Angiographic study: No evidence of a pulmonary embolism. Great vessels are normal in caliber. No atherosclerotic plaque along the aorta. No dissection. Neck base and axilla: No mass or adenopathy. Visualized thyroid is unremarkable. Mediastinum and hila: Heart normal in size. No coronary artery calcifications. No mediastinal or hilar masses or enlarged lymph nodes. Lungs and pleura: Mild lower lobe dependent subsegmental atelectasis. No evidence of pneumonia or pulmonary edema. No lung mass or suspicious nodule. No pleural effusion or pneumothorax. Limited upper abdomen:  Unremarkable. Musculoskeletal: Minor spurring along the thoracic spine. Otherwise unremarkable. Review of the MIP images confirms the above findings. IMPRESSION: 1. No evidence of a pulmonary embolism. 2. No evidence of pneumonia or pulmonary edema. Mild dependent atelectasis in the lungs. No acute findings. Electronically Signed   By: Amie Portland M.D.   On: 02/12/2016 19:14   Dg Knee Left Port  01/30/2016  CLINICAL DATA:  Status post left total knee replacement. History of joint disease. EXAM: PORTABLE LEFT KNEE - 1-2 VIEW COMPARISON:  None. FINDINGS: There is a total knee arthroplasty. The left knee is located. Negative for a periprosthetic fracture. Expected lucency in the soft tissues from recent surgery. IMPRESSION: Left total knee arthroplasty with expected postsurgical changes. Electronically Signed   By: Richarda Overlie M.D.   On: 01/30/2016 10:11    MDM  In light of patient's lightheadedness upon walking concern for cardiomyopathy. Other causes of dyspnea Dr Toniann Fail  consulted plan telemetry, 23 hour observation. Patient may need echocardiogram or other cardiac evaluation. Patient mildly anemic but hemoglobin is unchanged from 11 days ago Dx #1 acute dyspnea #2 anemia Final diagnoses:  None        Doug Sou, MD 02/12/16 [phone]

## 2016-02-12 NOTE — ED Notes (Addendum)
Received verbal order to ambulate pt Pt not on O2 here or at home Pt able to ambulate with assistance of walker about 85ft Pt c/o of severe lightheadedness during ambulation Pt very flush and somewhat diaphoretic  Pt O2 Sat dropped to 87%- 89% while ambulating, Pulse up to 122 Pt did not c/o of dyspnea  Immediate V/S after ambulation: P121, O2 Sat 93% RA, BP 155/89, R18 Pt brought back to room and assisted in bed EDP Jacubowitz notified

## 2016-02-12 NOTE — ED Notes (Signed)
Patient transported to CT 

## 2016-02-12 NOTE — ED Notes (Signed)
Pt c/o SOB, dizziness on exertion or standing. Pt had knee surgery 2 weeks ago. On xarelto. Sent by PCP to r/o PE.

## 2016-02-12 NOTE — ED Notes (Signed)
MD at bedside. EDP J PRESENT AT [facility] AWARE OF CURRENT VITALS

## 2016-02-12 NOTE — H&P (Signed)
[facility] History and Physical  Patricia [name] ZOX:[id number] DOB: 10-30-1961 DOA: 02/12/2016  Referring physician: Dr. Rennis Chris. PCP: Gaye Alken, MD  Specialists: None.  Chief Complaint: Shortness of breath.  HPI: Patricia [name] is a 55 y.o. female with no significant past medical history was referred to the ER patient was complaining of shortness of breath. Patient has had recent left knee surgery by Dr. Magnus Ivan and had followed up with Dr. Magnus Ivan today when patient was complaining of exertional shortness of breath with dizziness. Patient is getting hypoxic on exertion in the ER. CT angiogram of the chest was negative for PE. Patient has been admitted for further management of dyspnea on exertion. Patient at this time on my exam is not in distress.   Review of Systems: As presented in the history of presenting illness, rest negative.  Past Medical History  Diagnosis Date  . Anxiety   . Dysplasia of cervix, low grade (CIN 1) 1987    cryo  . Hyperlipemia   . Arthritis     oa  . Loose body in joint     right knee  . Complication of anesthesia     does not like needles, has passsed out with needle insertion   Past Surgical History  Procedure Laterality Date  . Liposuction    . Knee surgery Left 12-12-14    lateral release and meniscus repair  . Eye surgery Bilateral 01-13-2005  . Uterine ablation  07-14-2007  . Augmentation mammaplasty      2009 ruptured silicone implants, replaced with saline 2009  . Joint replacement  12-13-2009    toe joint left big toe  . Total knee arthroplasty Left 01/30/2016    Procedure: LEFT TOTAL KNEE ARTHROPLASTY;  Surgeon: Kathryne Hitch, MD;  Location: [facility];  Service: Orthopedics;  Laterality: Left;   Social History:  reports that she has never smoked. She has never used smokeless tobacco. She reports that she drinks about 1.2 - 2.4 oz of alcohol per week. She reports that she does not use illicit  drugs. Where does patient live home. Can patient participate in ADLs? Yes.  No Known Allergies  Family History:  Family History  Problem Relation Age of Onset  . Hypertension Mother 78  . Alzheimer's disease Mother   . Lymphoma Father   . Cancer Father     brain tumor      Prior to Admission medications   Medication Sig Start Date End Date Taking? Authorizing Provider  Acetaminophen (TYLENOL ARTHRITIS PAIN PO) Take 650 mg by mouth every morning.   Yes Historical Provider, MD  Acetaminophen-Aspirin Buffered (EXCEDRIN BACK & BODY PO) Take 1 tablet by mouth daily as needed (pain).   Yes Historical Provider, MD  Artificial Tear Ointment (DRY EYES OP) Apply 1 drop to eye every morning.   Yes Historical Provider, MD  B Complex-C (B-COMPLEX WITH VITAMIN C) tablet Take 1 tablet by mouth daily.   Yes Historical Provider, MD  Calcium Carbonate-Vitamin D (CALCIUM + D PO) Take 1 tablet by mouth every morning.    Yes Historical Provider, MD  cetirizine (ZYRTEC) 10 MG tablet Take 10 mg by mouth every morning.    Yes Historical Provider, MD  Cholecalciferol (VITAMIN D PO) Take 1,000 Int'l Units by mouth every morning.    Yes Historical Provider, MD  cyclobenzaprine (FLEXERIL) 10 MG tablet Take 1 tablet (10 mg total) by mouth 3 (three) times daily as needed for muscle spasms. 02/01/16  Yes Vanita Panda  Magnus Ivan, MD  HYDROmorphone (DILAUDID) 2 MG tablet Take 2-4 mg by mouth every 4 (four) hours as needed for moderate pain or severe pain.  02/05/16  Yes Historical Provider, MD  PARoxetine (PAXIL) 10 MG tablet Take 1 tablet (10 mg total) by mouth every morning. 07/26/14  Yes Brook Rosalin Hawking, MD  rivaroxaban (XARELTO) 10 MG TABS tablet Take 1 tablet (10 mg total) by mouth daily with breakfast. 02/01/16  Yes Kathryne Hitch, MD  oxyCODONE (OXY IR/ROXICODONE) 5 MG immediate release tablet Take 1-3 tablets (5-15 mg total) by mouth every 3 (three) hours as needed for breakthrough pain. Patient not  taking: Reported on 02/12/2016 02/01/16   Kathryne Hitch, MD    Physical Exam: Filed Vitals:   02/12/16 1830 02/12/16 2000 02/12/16 2200 02/12/16 2226  BP: 115/60 117/53 111/59 124/56  Pulse: 84 88 93 88  Temp:    98.2 F (36.8 C)  TempSrc:    Oral  Resp: 12 19 11 16   Height:    5\' 6"  (1.676 m)  SpO2: 100% 97% 100% 100%     General:  Moderately built and nourished.  Eyes: Anicteric no pallor.  ENT: No discharge from the ears eyes nose or mouth.  Neck: No mass felt.  Cardiovascular: S1 and S2 heard.  Respiratory: No rhonchi or crepitations.  Abdomen: Soft nontender bowel sounds present.  Skin: No rash.  Musculoskeletal: No edema. Left knee swollen from recent surgery.  Psychiatric: Appears normal.  Neurologic: Alert and oriented to time place and person. Moves all extremities.  Labs on Admission:  Basic Metabolic Panel:  Recent Labs Lab 02/12/16 1755  NA 137  K 3.9  CL 103  CO2 24  GLUCOSE 116*  BUN 27*  CREATININE 0.76  CALCIUM 9.7   Liver Function Tests: No results for input(s): AST, ALT, ALKPHOS, BILITOT, PROT, ALBUMIN in the last 168 hours. No results for input(s): LIPASE, AMYLASE in the last 168 hours. No results for input(s): AMMONIA in the last 168 hours. CBC:  Recent Labs Lab 02/12/16 1755  WBC 11.9*  HGB 11.4*  HCT 34.7*  MCV 88.3  PLT 463*   Cardiac Enzymes:  Recent Labs Lab 02/12/16 1755  TROPONINI <0.03    BNP (last 3 results) No results for input(s): BNP in the last 8760 hours.  ProBNP (last 3 results) No results for input(s): PROBNP in the last 8760 hours.  CBG: No results for input(s): GLUCAP in the last 168 hours.  Radiological Exams on Admission: Dg Chest 2 View  02/12/2016  CLINICAL DATA:  Shortness of breath.  Knee surgery on 01/30/2016 EXAM: CHEST  2 VIEW COMPARISON:  None. FINDINGS: The heart size and mediastinal contours are within normal limits. Both lungs are clear. The visualized skeletal structures  are unremarkable. IMPRESSION: No active cardiopulmonary disease. Electronically Signed   By: Gaylyn Rong M.D.   On: 02/12/2016 17:33   Ct Angio Chest Pe W/cm &/or Wo Cm  02/12/2016  CLINICAL DATA:  Pt c/o SOB, dizziness on exertion or standing sudden onset today. Pt had left TKR 01/30/16. On xarelto. Sent by PCP to r/o PE. EXAM: CT ANGIOGRAPHY CHEST WITH CONTRAST TECHNIQUE: Multidetector CT imaging of the chest was performed using the standard protocol during bolus administration of intravenous contrast. Multiplanar CT image reconstructions and MIPs were obtained to evaluate the vascular anatomy. CONTRAST:  OMNIPAQUE IOHEXOL 350 MG/ML SOLN COMPARISON:  Current chest radiographs. FINDINGS: Angiographic study: No evidence of a pulmonary embolism. Great vessels  are normal in caliber. No atherosclerotic plaque along the aorta. No dissection. Neck base and axilla: No mass or adenopathy. Visualized thyroid is unremarkable. Mediastinum and hila: Heart normal in size. No coronary artery calcifications. No mediastinal or hilar masses or enlarged lymph nodes. Lungs and pleura: Mild lower lobe dependent subsegmental atelectasis. No evidence of pneumonia or pulmonary edema. No lung mass or suspicious nodule. No pleural effusion or pneumothorax. Limited upper abdomen:  Unremarkable. Musculoskeletal: Minor spurring along the thoracic spine. Otherwise unremarkable. Review of the MIP images confirms the above findings. IMPRESSION: 1. No evidence of a pulmonary embolism. 2. No evidence of pneumonia or pulmonary edema. Mild dependent atelectasis in the lungs. No acute findings. Electronically Signed   By: Amie Portland M.D.   On: 02/12/2016 19:14    EKG: Independently reviewed. Normal sinus rhythm.  Assessment/Plan Principal Problem:   Dyspnea on exertion   1. Dyspnea on exertion - CT angiogram of the chest was negative. Since patient was getting hypoxic on exertion in the ER. Will check 2-D echo and cycle  cardiac markers. Further recommendations based on the test ordered. 2. Recent left knee surgery - being followed by Dr. Magnus Ivan.   DVT Prophylaxis xarelto.  Code Status: Full code.  Family Communication: Patient's husband.  Disposition Plan: Admit to inpatient.    Patricia [name] N. [facility] Pager [phone].  If 7PM-7AM, please contact night-coverage [email or web address] Password [email or web address] 02/12/2016, 11:15 PM

## 2016-02-12 NOTE — ED Notes (Signed)
Bed: WA11 Expected date:  Expected time:  Means of arrival:  Comments: Triage 1 

## 2016-02-13 ENCOUNTER — Observation Stay (HOSPITAL_BASED_OUTPATIENT_CLINIC_OR_DEPARTMENT_OTHER): Payer: BLUE CROSS/BLUE SHIELD

## 2016-02-13 DIAGNOSIS — R0902 Hypoxemia: Secondary | ICD-10-CM | POA: Diagnosis not present

## 2016-02-13 DIAGNOSIS — R0609 Other forms of dyspnea: Secondary | ICD-10-CM | POA: Diagnosis not present

## 2016-02-13 DIAGNOSIS — R0600 Dyspnea, unspecified: Secondary | ICD-10-CM

## 2016-02-13 LAB — TROPONIN I
Troponin I: <0.03 ng/mL (ref <0.031)
Troponin I: <0.03 ng/mL (ref <0.031)
Troponin I: <0.03 ng/mL (ref <0.031)

## 2016-02-13 LAB — CBC
HEMATOCRIT: 33.3 % — AB (ref 36.0–46.0)
HEMOGLOBIN: 10.7 g/dL — AB (ref 12.0–15.0)
MCH: 29 pg (ref 26.0–34.0)
MCHC: 32.1 g/dL (ref 30.0–36.0)
MCV: 90.2 fL (ref 78.0–100.0)
Platelets: 459 10*3/uL — ABNORMAL HIGH (ref 150–400)
RBC: 3.69 MIL/uL — AB (ref 3.87–5.11)
RDW: 12.6 % (ref 11.5–15.5)
WBC: 10.1 10*3/uL (ref 4.0–10.5)

## 2016-02-13 LAB — URINALYSIS, ROUTINE W REFLEX MICROSCOPIC
Bilirubin Urine: NEGATIVE
Glucose, UA: NEGATIVE mg/dL
Hgb urine dipstick: NEGATIVE
KETONES UR: NEGATIVE mg/dL
NITRITE: NEGATIVE
PH: 5.5 (ref 5.0–8.0)
Protein, ur: NEGATIVE mg/dL
Specific Gravity, Urine: 1.021 (ref 1.005–1.030)

## 2016-02-13 LAB — BASIC METABOLIC PANEL
ANION GAP: 11 (ref 5–15)
BUN: 19 mg/dL (ref 6–20)
CO2: 24 mmol/L (ref 22–32)
Calcium: 9.5 mg/dL (ref 8.9–10.3)
Chloride: 104 mmol/L (ref 101–111)
Creatinine, Ser: 0.61 mg/dL (ref 0.44–1.00)
Glucose, Bld: 104 mg/dL — ABNORMAL HIGH (ref 65–99)
POTASSIUM: 3.7 mmol/L (ref 3.5–5.1)
SODIUM: 139 mmol/L (ref 135–145)

## 2016-02-13 LAB — URINE MICROSCOPIC-ADD ON

## 2016-02-13 LAB — TSH: TSH: 1.307 u[IU]/mL (ref 0.350–4.500)

## 2016-02-13 MED ORDER — GUAIFENESIN ER 600 MG PO TB12
1200.0000 mg | ORAL_TABLET | Freq: Two times a day (BID) | ORAL | Status: DC
  Administered 2016-02-14 – 2016-02-15 (×3): 1200 mg via ORAL
  Filled 2016-02-13 (×4): qty 2

## 2016-02-13 MED ORDER — DOCUSATE SODIUM 100 MG PO CAPS
100.0000 mg | ORAL_CAPSULE | Freq: Two times a day (BID) | ORAL | Status: DC
  Administered 2016-02-13 – 2016-02-15 (×5): 100 mg via ORAL
  Filled 2016-02-13 (×5): qty 1

## 2016-02-13 NOTE — Progress Notes (Signed)
Echocardiogram 2D Echocardiogram has been performed.  Patricia [name], Tony 02/13/2016, 11:36 AM

## 2016-02-13 NOTE — Progress Notes (Signed)
[facility] PROGRESS NOTE   Patricia [name] ZOX:[id number] DOB: 08-28-1961 DOA: 02/12/2016 PCP: Gaye Alken, MD  HPI/Subjective: Seen with husband at bedside, denies any new complaints. Sitting without any complaints of SOB, she mentioned that she had SOB and tachycardia after 5 steps when she was walking with PT.  Assessment/Plan: Principal Problem:   Dyspnea on exertion   Dyspnea Patient presented with dyspnea on exertion. CT angiography showed no evidence of PE. No evidence of infection, pneumonia no fever or ILI to suggest fluid. 2-D echo did not showed no evidence of cardiac abnormality. Rule out infections, check urinalysis.  Recent left sided TKR Continue prophylactic Xarelto and PT.   Code Status: Full Code Family Communication: Plan discussed with the patient. Disposition Plan: Remains inpatient Diet: Diet regular Room service appropriate?: Yes; Fluid consistency:: Thin  Consultants:  None  Procedures:  None  Antibiotics:  None   Objective: Filed Vitals:   02/13/16 0451 02/13/16 1521  BP: 111/50 124/81  Pulse: 79 98  Temp: 97.7 F (36.5 C) 98.1 F (36.7 C)  Resp: 18 20    Intake/Output Summary (Last 24 hours) at 02/13/16 1528 Last data filed at 02/13/16 1521  Gross per 24 hour  Intake    480 ml  Output      0 ml  Net    480 ml   Filed Weights   02/12/16 2226  Weight: 70.126 kg (154 lb 9.6 oz)    Exam: General: Alert and awake, oriented x3, not in any acute distress. HEENT: anicteric sclera, pupils reactive to light and accommodation, EOMI CVS: S1-S2 clear, no murmur rubs or gallops Chest: clear to auscultation bilaterally, no wheezing, rales or rhonchi Abdomen: soft nontender, nondistended, normal bowel sounds, no organomegaly Extremities: no cyanosis, clubbing or edema noted bilaterally Neuro: Cranial nerves II-XII intact, no focal neurological deficits  Data Reviewed: Basic Metabolic Panel:  Recent  Labs Lab 02/12/16 1755 02/13/16 0454  NA 137 139  K 3.9 3.7  CL 103 104  CO2 24 24  GLUCOSE 116* 104*  BUN 27* 19  CREATININE 0.76 0.61  CALCIUM 9.7 9.5   Liver Function Tests: No results for input(s): AST, ALT, ALKPHOS, BILITOT, PROT, ALBUMIN in the last 168 hours. No results for input(s): LIPASE, AMYLASE in the last 168 hours. No results for input(s): AMMONIA in the last 168 hours. CBC:  Recent Labs Lab 02/12/16 1755 02/13/16 0454  WBC 11.9* 10.1  HGB 11.4* 10.7*  HCT 34.7* 33.3*  MCV 88.3 90.2  PLT 463* 459*   Cardiac Enzymes:  Recent Labs Lab 02/12/16 1755 02/12/16 2348 02/13/16 0454 02/13/16 1040  TROPONINI <0.03 <0.03 <0.03 <0.03   BNP (last 3 results) No results for input(s): BNP in the last 8760 hours.  ProBNP (last 3 results) No results for input(s): PROBNP in the last 8760 hours.  CBG: No results for input(s): GLUCAP in the last 168 hours.  Micro No results found for this or any previous visit (from the past 240 hour(s)).   Studies: Dg Chest 2 View  02/12/2016  CLINICAL DATA:  Shortness of breath.  Knee surgery on 01/30/2016 EXAM: CHEST  2 VIEW COMPARISON:  None. FINDINGS: The heart size and mediastinal contours are within normal limits. Both lungs are clear. The visualized skeletal structures are unremarkable. IMPRESSION: No active cardiopulmonary disease. Electronically Signed   By: Gaylyn Rong M.D.   On: 02/12/2016 17:33   Ct Angio Chest Pe W/cm &/or Wo Cm  02/12/2016  CLINICAL DATA:  Pt  c/o SOB, dizziness on exertion or standing sudden onset today. Pt had left TKR 01/30/16. On xarelto. Sent by PCP to r/o PE. EXAM: CT ANGIOGRAPHY CHEST WITH CONTRAST TECHNIQUE: Multidetector CT imaging of the chest was performed using the standard protocol during bolus administration of intravenous contrast. Multiplanar CT image reconstructions and MIPs were obtained to evaluate the vascular anatomy. CONTRAST:  100mL OMNIPAQUE IOHEXOL 350 MG/ML SOLN COMPARISON:   Current chest radiographs. FINDINGS: Angiographic study: No evidence of a pulmonary embolism. Great vessels are normal in caliber. No atherosclerotic plaque along the aorta. No dissection. Neck base and axilla: No mass or adenopathy. Visualized thyroid is unremarkable. Mediastinum and hila: Heart normal in size. No coronary artery calcifications. No mediastinal or hilar masses or enlarged lymph nodes. Lungs and pleura: Mild lower lobe dependent subsegmental atelectasis. No evidence of pneumonia or pulmonary edema. No lung mass or suspicious nodule. No pleural effusion or pneumothorax. Limited upper abdomen:  Unremarkable. Musculoskeletal: Minor spurring along the thoracic spine. Otherwise unremarkable. Review of the MIP images confirms the above findings. IMPRESSION: 1. No evidence of a pulmonary embolism. 2. No evidence of pneumonia or pulmonary edema. Mild dependent atelectasis in the lungs. No acute findings. Electronically Signed   By: Amie Portlandavid  Ormond M.D.   On: 02/12/2016 19:14    Scheduled Meds: . acetaminophen  650 mg Oral q morning - 10a  . B-complex with vitamin C  1 tablet Oral Daily  . loratadine  10 mg Oral Daily  . PARoxetine  10 mg Oral [id number]  . rivaroxaban  10 mg Oral Q breakfast   Continuous Infusions:      Time spent: 35 minutes    [facility] EndoscopyELMAHI,Ruhaan Nordahl A  [facility] Pager [phone] If 7PM-7AM, please contact night-coverage at [email or web address], password [email or web address] 02/13/2016, 3:28 PM

## 2016-02-13 NOTE — Evaluation (Signed)
Physical Therapy Evaluation Patient Details Name: Patricia [name] MRN: [id number] DOB: 10/13/1961 Today's Date: 02/13/2016   History of Present Illness  55 yo female no significant past medical history was referred to the ER patient was complaining of shortness of breath PMHx: 01/30/16 L TKR per Dr. Magnus IvanBlackman  Clinical Impression  Pt admitted with above diagnosis. Pt currently with functional limitations due to the deficits listed below (see PT Problem List).  Pt will benefit from skilled PT to increase their independence and safety with mobility to allow discharge to the venue listed below.   Pt limited by DOE, lightheadedness  And incr HR this am; HR 88 at rest, 114 in standing and 137 with amb, decr to 90s with 2 min seated rest; will continue to follow to continue knee rehab, discussed with pt and husband; encouraged pt to do ex's on her own as well;     Follow Up Recommendations Home health PT    Equipment Recommendations  None recommended by PT    Recommendations for Other Services       Precautions / Restrictions Precautions Precautions: Knee;Fall Restrictions Weight Bearing Restrictions: No Other Position/Activity Restrictions: WBAT      Mobility  Bed Mobility   Bed Mobility: Supine to Sit     Supine to sit: Modified independent (Device/Increase time)        Transfers Overall transfer level: Needs assistance Equipment used: Rolling walker (2 wheeled) Transfers: Sit to/from Stand Sit to Stand: Supervision         General transfer comment: VC's on proper  hand placement  Ambulation/Gait Ambulation/Gait assistance: Min guard Ambulation Distance (Feet): 50 Feet Assistive device: Rolling walker (2 wheeled) Gait Pattern/deviations: Step-to pattern;Step-through pattern;Decreased step length - right;Decreased step length - left;Decreased weight shift to left Gait velocity: decr   General Gait Details: cues for RW position, distance limited d/t  "lightheadedness", incr HR-up to 137 with amb  Stairs            Wheelchair Mobility    Modified Rankin (Stroke Patients Only)       Balance                                             Pertinent Vitals/Pain Pain Assessment: 0-10 Pain Score: 3  Pain Location: L knee Pain Intervention(s): Limited activity within patient's tolerance;Monitored during session;Ice applied    Home Living Family/patient expects to be discharged to:: Private residence Living Arrangements: Spouse/significant other Available Help at Discharge: Family Type of Home: House Home Access: Stairs to enter   Secretary/administratorntrance Stairs-Number of Steps: 3 Home Layout: Two level;1/2 bath on main level Home Equipment: Walker - 2 wheels;Cane - single point;Crutches      Prior Function Level of Independence: Independent with assistive device(s)               Hand Dominance        Extremity/Trunk Assessment   Upper Extremity Assessment: Defer to OT evaluation           Lower Extremity Assessment: LLE deficits/detail   LLE Deficits / Details: knee extension 3/5; knee AARON 5 to ~80* flexion     Communication   Communication: No difficulties  Cognition Arousal/Alertness: Awake/alert Behavior During Therapy: WFL for tasks assessed/performed Overall Cognitive Status: Within Functional Limits for tasks assessed  General Comments      Exercises Total Joint Exercises Ankle Circles/Pumps: AROM;Both;10 reps Quad Sets: 15 reps;Both;AROM;Strengthening Short Arc Quad: Strengthening;Left;15 reps Heel Slides: 10 reps;Left;AROM;AAROM Straight Leg Raises: AAROM;Left;Supine;15 reps;Strengthening Knee Flexion: AAROM;Left;5 reps;Seated Goniometric ROM: grossly 5-80* flexion      Assessment/Plan    PT Assessment Patient needs continued PT services  PT Diagnosis Difficulty walking   PT Problem List Decreased strength;Decreased range of motion;Decreased  activity tolerance;Decreased mobility;Decreased knowledge of use of DME;Cardiopulmonary status limiting activity  PT Treatment Interventions DME instruction;Gait training;Stair training;Functional mobility training;Therapeutic activities;Therapeutic exercise;Patient/family education   PT Goals (Current goals can be found in the Care Plan section) Acute Rehab PT Goals Patient Stated Goal: find out what is going on, go home soon PT Goal Formulation: With patient Time For Goal Achievement: 02/20/16 Potential to Achieve Goals: Good    Frequency Min 3X/week   Barriers to discharge        Co-evaluation               End of Session Equipment Utilized During Treatment: Gait belt Activity Tolerance: Treatment limited secondary to medical complications (Comment) Patient left: in chair;with call bell/phone within reach;with family/visitor present      Functional Assessment Tool Used: clinical judgement Functional Limitation: Mobility: Walking and moving around Mobility: Walking and Moving Around Current Status [phone]): At least 1 percent but less than 20 percent impaired, limited or restricted Mobility: Walking and Moving Around Goal Status [phone]): At least 1 percent but less than 20 percent impaired, limited or restricted    Time: 1050-1117 PT Time Calculation (min) (ACUTE ONLY): 27 min   Charges:   PT Evaluation $PT Eval Moderate Complexity: 1 Procedure PT Treatments $Gait Training: 8-22 mins   PT G Codes:   PT G-Codes **NOT FOR INPATIENT CLASS** Functional Assessment Tool Used: clinical judgement Functional Limitation: Mobility: Walking and moving around Mobility: Walking and Moving Around Current Status ([id number]): At least 1 percent but less than 20 percent impaired, limited or restricted Mobility: Walking and Moving Around Goal Status [phone]): At least 1 percent but less than 20 percent impaired, limited or restricted    [facility] 02/13/2016, 11:27 AM

## 2016-02-14 DIAGNOSIS — R0609 Other forms of dyspnea: Secondary | ICD-10-CM | POA: Diagnosis not present

## 2016-02-14 DIAGNOSIS — R0902 Hypoxemia: Secondary | ICD-10-CM | POA: Diagnosis not present

## 2016-02-14 LAB — BASIC METABOLIC PANEL
ANION GAP: 9 (ref 5–15)
BUN: 24 mg/dL — AB (ref 6–20)
CALCIUM: 9.7 mg/dL (ref 8.9–10.3)
CO2: 26 mmol/L (ref 22–32)
Chloride: 106 mmol/L (ref 101–111)
Creatinine, Ser: 0.67 mg/dL (ref 0.44–1.00)
GFR calc Af Amer: >60 mL/min (ref >60)
GFR calc non Af Amer: >60 mL/min (ref >60)
GLUCOSE: 97 mg/dL (ref 65–99)
Potassium: 4.2 mmol/L (ref 3.5–5.1)
Sodium: 141 mmol/L (ref 135–145)

## 2016-02-14 LAB — CBC
HCT: 35 % — ABNORMAL LOW (ref 36.0–46.0)
HEMOGLOBIN: 11.2 g/dL — AB (ref 12.0–15.0)
MCH: 28.8 pg (ref 26.0–34.0)
MCHC: 32 g/dL (ref 30.0–36.0)
MCV: 90 fL (ref 78.0–100.0)
Platelets: 448 10*3/uL — ABNORMAL HIGH (ref 150–400)
RBC: 3.89 MIL/uL (ref 3.87–5.11)
RDW: 12.7 % (ref 11.5–15.5)
WBC: 8.2 10*3/uL (ref 4.0–10.5)

## 2016-02-14 MED ORDER — SODIUM CHLORIDE 0.9 % IV SOLN
INTRAVENOUS | Status: DC
  Administered 2016-02-14: 100 mL/h via INTRAVENOUS
  Administered 2016-02-14: 21:00:00 via INTRAVENOUS

## 2016-02-14 MED ORDER — DEXTROSE 5 % IV SOLN
1.0000 g | INTRAVENOUS | Status: DC
  Administered 2016-02-14 – 2016-02-15 (×2): 1 g via INTRAVENOUS
  Filled 2016-02-14 (×2): qty 10

## 2016-02-14 NOTE — Progress Notes (Signed)
[facility] PROGRESS NOTE   Patricia [name] WGN:[id number] DOB: 01/02/1961 DOA: 02/12/2016 PCP: Gaye AlkenBARNES,ELIZABETH STEWART, MD  HPI/Subjective: Still has SOB and hypoxia with ambulation.  Assessment/Plan: Principal Problem:   Dyspnea on exertion Active Problems:   Hypoxia   Dyspnea Patient presented with dyspnea on exertion. CT angiography showed no evidence of PE. No evidence of infection, pneumonia no fever or ILI to suggest fluid. 2-D echo did not showed no evidence of cardiac abnormality. Check orthostatic vitals, started on fluids.  Questionable UTI Urinalysis showed pus cells and rare bacteria, started on Rocephin.  Hypoxia Hypoxia with exercise, CT angiography negative for acute abnormalities.  Recent left sided TKR Continue prophylactic Xarelto and PT.   Code Status: Full Code Family Communication: Plan discussed with the patient. Disposition Plan: Remains inpatient Diet: Diet regular Room service appropriate?: Yes; Fluid consistency:: Thin  Consultants:  None  Procedures:  None  Antibiotics:  None   Objective: Filed Vitals:   02/13/16 1958 02/14/16 0508  BP: 115/41 93/38  Pulse: 87 81  Temp: 97.8 F (36.6 C) 97.9 F (36.6 C)  Resp: 20 20    Intake/Output Summary (Last 24 hours) at 02/14/16 1119 Last data filed at 02/14/16 1012  Gross per 24 hour  Intake    240 ml  Output   1875 ml  Net  -1635 ml   Filed Weights   02/12/16 2226  Weight: 70.126 kg (154 lb 9.6 oz)    Exam: General: Alert and awake, oriented x3, not in any acute distress. HEENT: anicteric sclera, pupils reactive to light and accommodation, EOMI CVS: S1-S2 clear, no murmur rubs or gallops Chest: clear to auscultation bilaterally, no wheezing, rales or rhonchi Abdomen: soft nontender, nondistended, normal bowel sounds, no organomegaly Extremities: no cyanosis, clubbing or edema noted bilaterally Neuro: Cranial nerves II-XII intact, no focal neurological  deficits  Data Reviewed: Basic Metabolic Panel:  Recent Labs Lab 02/12/16 1755 02/13/16 0454 02/14/16 0537  NA 137 139 141  K 3.9 3.7 4.2  CL 103 104 106  CO2 24 24 26   GLUCOSE 116* 104* 97  BUN 27* 19 24*  CREATININE 0.76 0.61 0.67  CALCIUM 9.7 9.5 9.7   Liver Function Tests: No results for input(s): AST, ALT, ALKPHOS, BILITOT, PROT, ALBUMIN in the last 168 hours. No results for input(s): LIPASE, AMYLASE in the last 168 hours. No results for input(s): AMMONIA in the last 168 hours. CBC:  Recent Labs Lab 02/12/16 1755 02/13/16 0454 02/14/16 0537  WBC 11.9* 10.1 8.2  HGB 11.4* 10.7* 11.2*  HCT 34.7* 33.3* 35.0*  MCV 88.3 90.2 90.0  PLT 463* 459* 448*   Cardiac Enzymes:  Recent Labs Lab 02/12/16 1755 02/12/16 2348 02/13/16 0454 02/13/16 1040  TROPONINI <0.03 <0.03 <0.03 <0.03   BNP (last 3 results) No results for input(s): BNP in the last 8760 hours.  ProBNP (last 3 results) No results for input(s): PROBNP in the last 8760 hours.  CBG: No results for input(s): GLUCAP in the last 168 hours.  Micro No results found for this or any previous visit (from the past 240 hour(s)).   Studies: Dg Chest 2 View  02/12/2016  CLINICAL DATA:  Shortness of breath.  Knee surgery on 01/30/2016 EXAM: CHEST  2 VIEW COMPARISON:  None. FINDINGS: The heart size and mediastinal contours are within normal limits. Both lungs are clear. The visualized skeletal structures are unremarkable. IMPRESSION: No active cardiopulmonary disease. Electronically Signed   By: Gaylyn RongWalter  Liebkemann M.D.   On: 02/12/2016 17:33  Ct Angio Chest Pe W/cm &/or Wo Cm  02/12/2016  CLINICAL DATA:  Pt c/o SOB, dizziness on exertion or standing sudden onset today. Pt had left TKR 01/30/16. On xarelto. Sent by PCP to r/o PE. EXAM: CT ANGIOGRAPHY CHEST WITH CONTRAST TECHNIQUE: Multidetector CT imaging of the chest was performed using the standard protocol during bolus administration of intravenous contrast.  Multiplanar CT image reconstructions and MIPs were obtained to evaluate the vascular anatomy. CONTRAST:  OMNIPAQUE IOHEXOL 350 MG/ML SOLN COMPARISON:  Current chest radiographs. FINDINGS: Angiographic study: No evidence of a pulmonary embolism. Great vessels are normal in caliber. No atherosclerotic plaque along the aorta. No dissection. Neck base and axilla: No mass or adenopathy. Visualized thyroid is unremarkable. Mediastinum and hila: Heart normal in size. No coronary artery calcifications. No mediastinal or hilar masses or enlarged lymph nodes. Lungs and pleura: Mild lower lobe dependent subsegmental atelectasis. No evidence of pneumonia or pulmonary edema. No lung mass or suspicious nodule. No pleural effusion or pneumothorax. Limited upper abdomen:  Unremarkable. Musculoskeletal: Minor spurring along the thoracic spine. Otherwise unremarkable. Review of the MIP images confirms the above findings. IMPRESSION: 1. No evidence of a pulmonary embolism. 2. No evidence of pneumonia or pulmonary edema. Mild dependent atelectasis in the lungs. No acute findings. Electronically Signed   By: Amie Portland M.D.   On: 02/12/2016 19:14    Scheduled Meds: . acetaminophen  650 mg Oral q morning - 10a  . B-complex with vitamin C  1 tablet Oral Daily  . cefTRIAXone (ROCEPHIN)  IV  1 g Intravenous Q24H  . docusate sodium  100 mg Oral BID  . guaiFENesin  1,200 mg Oral BID  . loratadine  10 mg Oral Daily  . PARoxetine  10 mg Oral BH-q7a  . rivaroxaban  10 mg Oral Q breakfast   Continuous Infusions: . sodium chloride         Time spent: 35 minutes    [facility] A  [facility] Pager [phone] If 7PM-7AM, please contact night-coverage at [email or web address], password [facility] 02/14/2016, 11:19 AM

## 2016-02-14 NOTE — Progress Notes (Signed)
Patient ID: Patricia [name], female   DOB: 12/21/1960, 55 y.o.   MRN: [id number] No acute changes from ortho standpoint.  Left knee stable.  Calf soft.  Foot not swollen.  Can still attempt to mobilize with therapy.

## 2016-02-15 DIAGNOSIS — R0902 Hypoxemia: Secondary | ICD-10-CM | POA: Diagnosis not present

## 2016-02-15 DIAGNOSIS — R0609 Other forms of dyspnea: Secondary | ICD-10-CM | POA: Diagnosis not present

## 2016-02-15 LAB — BASIC METABOLIC PANEL
ANION GAP: 8 (ref 5–15)
BUN: 17 mg/dL (ref 6–20)
CALCIUM: 9.1 mg/dL (ref 8.9–10.3)
CO2: 25 mmol/L (ref 22–32)
CREATININE: 0.67 mg/dL (ref 0.44–1.00)
Chloride: 107 mmol/L (ref 101–111)
GFR calc Af Amer: >60 mL/min (ref >60)
GLUCOSE: 104 mg/dL — AB (ref 65–99)
Potassium: 4.4 mmol/L (ref 3.5–5.1)
Sodium: 140 mmol/L (ref 135–145)

## 2016-02-15 LAB — URINE CULTURE

## 2016-02-15 LAB — CORTISOL: Cortisol, Plasma: 7.8 ug/dL

## 2016-02-15 MED ORDER — SODIUM CHLORIDE 0.9 % IV BOLUS (SEPSIS)
1000.0000 mL | Freq: Once | INTRAVENOUS | Status: AC
  Administered 2016-02-15: 1000 mL via INTRAVENOUS

## 2016-02-15 NOTE — Discharge Summary (Signed)
Physician Discharge Summary  Patricia [name]:[id number] DOB: 03/04/1961 DOA: 02/12/2016  PCP: Gaye Alken, MD  Admit date: 02/12/2016 Discharge date: 02/15/2016  Time spent: 40 minutes  Recommendations for Outpatient Follow-up:  1. Follow up with PCP in 1 week.   Discharge Diagnoses:  Principal Problem:   Dyspnea on exertion Active Problems:   Hypoxia   Discharge Condition: Stable  Diet recommendation: heart healthy  Filed Weights   02/12/16 2226  Weight: 70.126 kg (154 lb 9.6 oz)    History of present illness:  Patricia [name] is a 55 y.o. female with no significant past medical history was referred to the ER patient was complaining of shortness of breath. Patient has had recent left knee surgery by Dr. Magnus Ivan and had followed up with Dr. Magnus Ivan today when patient was complaining of exertional shortness of breath with dizziness. Patient is getting hypoxic on exertion in the ER. CT angiogram of the chest was negative for PE. Patient has been admitted for further management of dyspnea on exertion. Patient at this time on my exam is not in distress.   Hospital Course:   Dyspnea Patient presented with dyspnea on exertion. CT angiography showed no evidence of PE. No evidence of infection, pneumonia no fever or ILI to suggest Influenza. 2-D echo did not showed any evidence of cardiac abnormality. Normal Troponin and EKG. Cortisol and TSH adequate. Orthostatic vitals were positive, started on fluids, felt better. Presumed dehydrated after surgery. Not back completely to baseline but much improved after the fluids, advised to drink plenty of fluids and follow with PCP. If she still has the symptoms as outpatient, ,consider referral to cardiology.  Questionable UTI Urinalysis showed pus cells and rare bacteria, started on Rocephin. Culture showed multiple morphotypes, no antibiotics on discharge.  Hypoxia Hypoxia with exercise, CT angiography negative  for acute abnormalities.   Recent left sided TKR Continue prophylactic Xarelto and PT.  Procedures:  None  Consultations:  None  Discharge Exam: Filed Vitals:   02/15/16 1109 02/15/16 1315  BP: 108/61 112/70  Pulse: 96 90  Temp:  98.1 F (36.7 C)  Resp:  20   General: Alert and awake, oriented x3, not in any acute distress. HEENT: anicteric sclera, pupils reactive to light and accommodation, EOMI CVS: S1-S2 clear, no murmur rubs or gallops Chest: clear to auscultation bilaterally, no wheezing, rales or rhonchi Abdomen: soft nontender, nondistended, normal bowel sounds, no organomegaly Extremities: no cyanosis, clubbing or edema noted bilaterally Neuro: Cranial nerves II-XII intact, no focal neurological deficits   Discharge Instructions   Discharge Instructions    Diet - low sodium heart healthy    Complete by:  As directed      Increase activity slowly    Complete by:  As directed           Current Discharge Medication List    CONTINUE these medications which have NOT CHANGED   Details  Acetaminophen (TYLENOL ARTHRITIS PAIN PO) Take 650 mg by mouth every morning.    Acetaminophen-Aspirin Buffered (EXCEDRIN BACK & BODY PO) Take 1 tablet by mouth daily as needed (pain).    Artificial Tear Ointment (DRY EYES OP) Apply 1 drop to eye every morning.    B Complex-C (B-COMPLEX WITH VITAMIN C) tablet Take 1 tablet by mouth daily.    Calcium Carbonate-Vitamin D (CALCIUM + D PO) Take 1 tablet by mouth every morning.     cetirizine (ZYRTEC) 10 MG tablet Take 10 mg by mouth every morning.  Cholecalciferol (VITAMIN D PO) Take 1,000 Int'l Units by mouth every morning.     cyclobenzaprine (FLEXERIL) 10 MG tablet Take 1 tablet (10 mg total) by mouth 3 (three) times daily as needed for muscle spasms. Qty: 60 tablet, Refills: 0    HYDROmorphone (DILAUDID) 2 MG tablet Take 2-4 mg by mouth every 4 (four) hours as needed for moderate pain or severe pain.  Refills: 0     PARoxetine (PAXIL) 10 MG tablet Take 1 tablet (10 mg total) by mouth every morning. Qty: 30 tablet, Refills: 11    rivaroxaban (XARELTO) 10 MG TABS tablet Take 1 tablet (10 mg total) by mouth daily with breakfast. Qty: 15 tablet, Refills: 0      STOP taking these medications     oxyCODONE (OXY IR/ROXICODONE) 5 MG immediate release tablet        No Known Allergies Follow-up Information    Follow up with [facility].   Why:  Home Health Physical Therapy   Contact information:   [address] [place] [address] [phone]       Follow up with Gaye Alken, MD In 1 week.   Specialty:  Family Medicine   Contact information:   [address] [place] [address] [phone]        The results of significant diagnostics from this hospitalization (including imaging, microbiology, ancillary and laboratory) are listed below for reference.    Significant Diagnostic Studies: Dg Chest 2 View  02/12/2016  CLINICAL DATA:  Shortness of breath.  Knee surgery on 01/30/2016 EXAM: CHEST  2 VIEW COMPARISON:  None. FINDINGS: The heart size and mediastinal contours are within normal limits. Both lungs are clear. The visualized skeletal structures are unremarkable. IMPRESSION: No active cardiopulmonary disease. Electronically Signed   By: Gaylyn Rong M.D.   On: 02/12/2016 17:33   Ct Angio Chest Pe W/cm &/or Wo Cm  02/12/2016  CLINICAL DATA:  Pt c/o SOB, dizziness on exertion or standing sudden onset today. Pt had left TKR 01/30/16. On xarelto. Sent by PCP to r/o PE. EXAM: CT ANGIOGRAPHY CHEST WITH CONTRAST TECHNIQUE: Multidetector CT imaging of the chest was performed using the standard protocol during bolus administration of intravenous contrast. Multiplanar CT image reconstructions and MIPs were obtained to evaluate the vascular anatomy. CONTRAST:  OMNIPAQUE IOHEXOL 350 MG/ML SOLN COMPARISON:  Current chest radiographs. FINDINGS: Angiographic  study: No evidence of a pulmonary embolism. Great vessels are normal in caliber. No atherosclerotic plaque along the aorta. No dissection. Neck base and axilla: No mass or adenopathy. Visualized thyroid is unremarkable. Mediastinum and hila: Heart normal in size. No coronary artery calcifications. No mediastinal or hilar masses or enlarged lymph nodes. Lungs and pleura: Mild lower lobe dependent subsegmental atelectasis. No evidence of pneumonia or pulmonary edema. No lung mass or suspicious nodule. No pleural effusion or pneumothorax. Limited upper abdomen:  Unremarkable. Musculoskeletal: Minor spurring along the thoracic spine. Otherwise unremarkable. Review of the MIP images confirms the above findings. IMPRESSION: 1. No evidence of a pulmonary embolism. 2. No evidence of pneumonia or pulmonary edema. Mild dependent atelectasis in the lungs. No acute findings. Electronically Signed   By: Amie Portland M.D.   On: 02/12/2016 19:14   Dg Knee Left Port  01/30/2016  CLINICAL DATA:  Status post left total knee replacement. History of joint disease. EXAM: PORTABLE LEFT KNEE - 1-2 VIEW COMPARISON:  None. FINDINGS: There is a total knee arthroplasty. The left knee is located. Negative for  a periprosthetic fracture. Expected lucency in the soft tissues from recent surgery. IMPRESSION: Left total knee arthroplasty with expected postsurgical changes. Electronically Signed   By: Richarda OverlieAdam  Henn M.D.   On: 01/30/2016 10:11    Microbiology: Recent Results (from the past 240 hour(s))  Urine culture     Status: None   Collection Time: 02/13/16  8:02 PM  Result Value Ref Range Status   Specimen Description URINE, CLEAN CATCH  Final   Special Requests NONE  Final   Culture   Final    MULTIPLE SPECIES PRESENT, SUGGEST RECOLLECTION [facility]     Report Status 02/15/2016 FINAL  Final     Labs: Basic Metabolic Panel:  Recent Labs Lab 02/12/16 1755 02/13/16 0454 02/14/16 0537 02/15/16 0529   NA 137 139 141 140  K 3.9 3.7 4.2 4.4  CL 103 104 106 107  CO2 24 24 26 25   GLUCOSE 116* 104* 97 104*  BUN 27* 19 24* 17  CREATININE 0.76 0.61 0.67 0.67  CALCIUM 9.7 9.5 9.7 9.1   Liver Function Tests: No results for input(s): AST, ALT, ALKPHOS, BILITOT, PROT, ALBUMIN in the last 168 hours. No results for input(s): LIPASE, AMYLASE in the last 168 hours. No results for input(s): AMMONIA in the last 168 hours. CBC:  Recent Labs Lab 02/12/16 1755 02/13/16 0454 02/14/16 0537  WBC 11.9* 10.1 8.2  HGB 11.4* 10.7* 11.2*  HCT 34.7* 33.3* 35.0*  MCV 88.3 90.2 90.0  PLT 463* 459* 448*   Cardiac Enzymes:  Recent Labs Lab 02/12/16 1755 02/12/16 2348 02/13/16 0454 02/13/16 1040  TROPONINI <0.03 <0.03 <0.03 <0.03   BNP: BNP (last 3 results) No results for input(s): BNP in the last 8760 hours.  ProBNP (last 3 results) No results for input(s): PROBNP in the last 8760 hours.  CBG: No results for input(s): GLUCAP in the last 168 hours.     Signed:  Clint LippsELMAHI,Tailey Top A MD.  [facility] 02/15/2016, 1:43 PM

## 2016-02-15 NOTE — Care Management Note (Signed)
Case Management Note  Patient Details  Name: Patricia [name] MRN: [id number] Date of Birth: 05/03/1961  Subjective/Objective:      Dyspnea on exertion, s/p Left TKA              Action/Plan: NCM spoke to pt and states she has one more [facility]. [facility]CM [facility] with resumption of care for HHPT. Pt has RW and 3n1 at home.   Expected Discharge Date:  02/15/2016            Expected Discharge Plan:  Home w Home Health Services  In-House Referral:  NA  Discharge planning Services  CM Consult  Post Acute Care Choice:  Home Health, Resumption of Svcs/PTA Provider Choice offered to:  Patient  DME Arranged:  N/A DME Agency:  NA  HH Arranged:  PT HH Agency:  [facility]  Status of Service:  Completed, signed off  Medicare Important Message Given:    Date Medicare IM Given:    Medicare IM give by:    Date Additional Medicare IM Given:    Additional Medicare Important Message give by:     If discussed at Long Length of Stay Meetings, dates discussed:    Additional Comments:  Elliot CousinShavis, Kraven Calk Ellen, RN 02/15/2016, 1:28 PM

## 2016-02-15 NOTE — Progress Notes (Signed)
Physical Therapy Treatment Patient Details Name: Patricia [name] MRN: [id number] DOB: 02/25/1961 Today's Date: 02/15/2016    History of Present Illness 55 yo female no significant past medical history was referred to the ER patient was complaining of shortness of breath PMHx: 01/30/16 L TKR per Dr. Magnus IvanBlackman    PT Comments    HR is 126 max for ambulation. RN notified. Plans DC today. Recommend HHPT until ready for OP.  Follow Up Recommendations  Home health PT     Equipment Recommendations  None recommended by PT    Recommendations for Other Services       Precautions / Restrictions Precautions Precautions: Knee;Fall Precaution Comments: monitor HR 150's    Mobility  Bed Mobility Overal bed mobility: Modified Independent       Supine to sit: Modified independent (Device/Increase time)        Transfers Overall transfer level: Modified independent Equipment used: Rolling walker (2 wheeled) Transfers: Sit to/from Stand Sit to Stand: Modified independent (Device/Increase time)            Ambulation/Gait Ambulation/Gait assistance: Supervision Ambulation Distance (Feet): 175 Feet Assistive device: Rolling walker (2 wheeled) Gait Pattern/deviations: Step-to pattern;Step-through pattern Gait velocity: decr   General Gait Details: patient's HR max 126during ambulation. BP 112/70. no dizziness   Stairs            Wheelchair Mobility    Modified Rankin (Stroke Patients Only)       Balance                                    Cognition Arousal/Alertness: Awake/alert                          Exercises Total Joint Exercises Heel Slides: AROM (patient had done before) Long Arc Quad: AROM;Left;10 reps;Seated Goniometric ROM: 5-90  Other Exercises Other Exercises: contract relax L knee flexion x 5    General Comments        Pertinent Vitals/Pain Pain Assessment: 0-10 Pain Score: 4  Pain Location: L knee during  ROM. Pain Descriptors / Indicators: Tightness Pain Intervention(s): Limited activity within patient's tolerance;Premedicated before session;Ice applied    Home Living                      Prior Function            PT Goals (current goals can now be found in the care plan section) Progress towards PT goals: Progressing toward goals    Frequency  Min 3X/week    PT Plan Current plan remains appropriate    Co-evaluation             End of Session   Activity Tolerance: Patient tolerated treatment well Patient left: in chair;with call bell/phone within reach;with family/visitor present     Time: 7829-56211300-1324 PT Time Calculation (min) (ACUTE ONLY): 24 min  Charges:  $Gait Training: 8-22 mins $Therapeutic Exercise: 8-22 mins                    G Codes:      Rada HayHill, Patricia [name] 02/15/2016, 2:43 PM  Blanchard KelchKaren Teresa [name] PT [phone]

## 2016-02-15 NOTE — Progress Notes (Signed)
Physical Therapy Treatment Patient Details Name: Patricia [name] MRN: [id number] DOB: 10/07/1961 Today's Date: 02/15/2016    History of Present Illness 55 yo female no significant past medical history was referred to the ER patient was complaining of shortness of breath PMHx: 01/30/16 L TKR per Dr. Magnus IvanBlackman    PT Comments    HR up to 151 while ambulating in the room, to get a bolus fluids, mildly dizzy per patient. Will check back later and ambulate again.  Follow Up Recommendations  Home health PT     Equipment Recommendations  None recommended by PT    Recommendations for Other Services       Precautions / Restrictions Precautions Precautions: Knee;Fall Precaution Comments: monitor HR 150's Restrictions Weight Bearing Restrictions: No    Mobility  Bed Mobility         Supine to sit: Modified independent (Device/Increase time)        Transfers Overall transfer level: Modified independent Equipment used: Rolling walker (2 wheeled) Transfers: Sit to/from Stand Sit to Stand: Modified independent (Device/Increase time)            Ambulation/Gait Ambulation/Gait assistance: Supervision Ambulation Distance (Feet): 25 Feet (x 2) Assistive device: Rolling walker (2 wheeled) Gait Pattern/deviations: Step-to pattern;Step-through pattern Gait velocity: decr   General Gait Details: patient's HR 151 after ambulating to BR. RN aware, MD  alo in. BP131/61   Stairs            Wheelchair Mobility    Modified Rankin (Stroke Patients Only)       Balance                                    Cognition Arousal/Alertness: Awake/alert                          Exercises Total Joint Exercises Heel Slides: AROM (patient had done before) Long Arc Quad: AROM;Left;10 reps;Seated Goniometric ROM: 5-90     General Comments        Pertinent Vitals/Pain Pain Score: 5  Pain Location: L knee during flexion Pain Descriptors /  Indicators: Tightness Pain Intervention(s): Limited activity within patient's tolerance;Premedicated before session;Ice applied    Home Living                      Prior Function            PT Goals (current goals can now be found in the care plan section) Progress towards PT goals: Progressing toward goals    Frequency  Min 3X/week    PT Plan Current plan remains appropriate    Co-evaluation             End of Session   Activity Tolerance: Treatment limited secondary to medical complications (Comment) Patient left: in chair;with call bell/phone within reach;with family/visitor present     Time: 1191-47820859-0920 PT Time Calculation (min) (ACUTE ONLY): 21 min  Charges:  $Therapeutic Exercise: 8-22 mins                    G Codes:      Rada HayHill, Jakeria Caissie Elizabeth 02/15/2016, 11:32 AM Blanchard KelchKaren Rael Yo PT [phone]

## 2016-03-01 ENCOUNTER — Ambulatory Visit: Payer: BLUE CROSS/BLUE SHIELD

## 2016-03-03 ENCOUNTER — Ambulatory Visit: Payer: BLUE CROSS/BLUE SHIELD | Attending: Orthopaedic Surgery

## 2016-03-03 DIAGNOSIS — M25662 Stiffness of left knee, not elsewhere classified: Secondary | ICD-10-CM

## 2016-03-03 DIAGNOSIS — M25562 Pain in left knee: Secondary | ICD-10-CM

## 2016-03-03 DIAGNOSIS — R269 Unspecified abnormalities of gait and mobility: Secondary | ICD-10-CM

## 2016-03-03 DIAGNOSIS — R600 Localized edema: Secondary | ICD-10-CM | POA: Diagnosis present

## 2016-03-03 NOTE — Therapy (Signed)
[facility] [address], [address] [place], [place], [address] Phone: [phone]   Fax:  [phone]  Physical Therapy Evaluation  Patient Details  Name: Patricia LowKimberly C Schrade MRN: [id number] Date of Birth: 06/23/1961 Referring Provider: Doneen PoissonBlackman, Christopher, MD  Encounter Date: 03/03/2016      PT End of Session - 03/03/16 1651    Visit Number 1   Date for PT Re-Evaluation 04/28/16   PT Start Time 1613   PT Stop Time 1658   PT Time Calculation (min) 45 min   Activity Tolerance Patient tolerated treatment well   Behavior [facility] for [facility] assessed/performed      Past Medical History  Diagnosis Date  . Anxiety   . Dysplasia of cervix, [name] grade (CIN 1) 1987    cryo  . Hyperlipemia   . Arthritis     oa  . Loose body in joint     right knee  . Complication of anesthesia     does not like needles, has passsed out with needle insertion    Past Surgical History  Procedure Laterality Date  . Liposuction    . Knee surgery Left 12-12-14    lateral release and meniscus repair  . Eye surgery Bilateral 01-13-2005  . Uterine ablation  07-14-2007  . Augmentation mammaplasty      2009 ruptured silicone implants, replaced with saline 2009  . Joint replacement  12-13-2009    toe joint left big toe  . Total knee arthroplasty Left 01/30/2016    Procedure: LEFT TOTAL KNEE ARTHROPLASTY;  Surgeon: Kathryne Hitchhristopher Y Blackman, MD;  Location: [facility];  Service: Orthopedics;  Laterality: Left;    There were no vitals filed for this visit.  Visit Diagnosis:  Knee pain, acute, left - Plan: PT plan of care cert/re-cert  Edema of left lower extremity - Plan: PT plan of care cert/re-cert  Abnormality of gait - Plan: PT plan of care cert/re-cert  Knee stiffness, left - Plan: PT plan of care cert/re-cert      Subjective Assessment - 03/03/16 1619    Subjective Pt presents to PT ~5 weeks s/p TKA performed 01/30/16.  Pt had injury to  Lt knee and had lateral release surgery ~1 year prior to TKA.  Pt had home health PT for 4 weeks.  She was readmitted to surgery ~2 weeks after surgery due to infection.     Pertinent History Lt TKA: 01/30/16   Limitations Standing;Walking   How long can you stand comfortably? limited to long periods of standing with housework   How long can you walk comfortably? not been walking much in the community   Patient Stated Goals improve gait, improve Lt knee AROM, strength, endurance, edema, normalize giat   Currently in Pain? Yes   Pain Score 3    Pain Location Knee   Pain Orientation Left   Pain Descriptors / Indicators Tightness;Aching   Pain Type Surgical pain   Pain Onset More than a month ago   Aggravating Factors  standing and walking too long, sleep at night   Pain Relieving Factors ice, Advil, rest/elevation            [facility] PT Assessment - 03/03/16 0001    Assessment   Medical Diagnosis s/p Lt total knee replacement   Referring Provider Doneen PoissonBlackman, Christopher, MD   Onset Date/Surgical Date 01/30/16   Next MD Visit 03/11/2016   Prior Therapy HHPT for 4 weeks   Precautions   Precautions None  Restrictions   Weight Bearing Restrictions No   Balance Screen   Has the patient fallen in the past 6 months No   Has the patient had a decrease in activity level because of a fear of falling?  No   Is the patient reluctant to leave their home because of a fear of falling?  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, exercise- not since surgery   Cognition   Overall Cognitive Status Within Functional Limits for tasks assessed   Observation/Other Assessments   Focus on  Therapeutic Outcomes (FOTO)  49% limitation   Posture/Postural Control   Posture/Postural Control No significant limitations   ROM / Strength   AROM / PROM / Strength AROM;PROM;Strength   AROM   Overall AROM  Deficits   Overall AROM Comments Rt knee 0-130 degrees   AROM Assessment Site Knee   Right/Left Knee Left   Left Knee Extension 9   Left Knee Flexion 120   PROM   Overall PROM  Deficits   PROM Assessment Site Knee   Right/Left Knee Left   Left Knee Extension 7   Left Knee Flexion 123   Strength   Overall Strength Deficits   Overall Strength Comments Rt hip and knee 4+/5 to 5/5 throughout. Lt hip flexion 4/5, abduction 4+/5   Strength Assessment Site Knee   Right/Left Knee Left   Left Knee Flexion 4+/5   Left Knee Extension 4+/5   Palpation   Patella mobility good patellar mobility in all directions   Palpation comment well healing surgical incision 7.5 inches with good mobiltiy.  Midpatellar circumference15.34 inches Lt, 15 inches Rt. Warmth about the Lt distal quad and lateral/medial joint lines.   Ambulation/Gait   Ambulation/Gait Yes   Ambulation/Gait Assistance 6: Modified independent (Device/Increase time)   Ambulation Distance (Feet) 100 Feet   Gait Pattern Step-through pattern;Decreased step length - left;Decreased stance time - left;Decreased hip/knee flexion - left   Ambulation Surface Level   Stairs Yes   Stair Management Technique One rail Right;Step to pattern   Number of Stairs 4   Height of Stairs 6                   [facility] Adult PT Treatment/Exercise - 03/03/16 0001    Exercises   Exercises Knee/Hip   Knee/Hip Exercises: Aerobic   Stationary Bike Level 2 x 6 minutes   Modalities   Modalities Vasopneumatic   Vasopneumatic   Number Minutes Vasopneumatic  10 minutes   Vasopnuematic Location  Knee   Vasopneumatic Pressure High   Vasopneumatic Temperature  3 snowflakes                  PT Short Term Goals - 03/03/16 1656    PT  SHORT TERM GOAL #1   Title be independent in initial HEP   Time 4   Period Weeks   Status New   PT SHORT TERM GOAL #2   Title ascend steps with step-over-step gait and use of 1 rail   Time 4   Period Weeks   Status New   PT SHORT TERM GOAL #3   Title ambulate in the  community for 1 hour for grocery shopping without limitation   Time 4   Period Weeks   Status New   PT SHORT TERM GOAL #4   Title report  a 30% reduction in Lt knee pain at the end of the day   Time 4   Period Weeks   Status New   PT SHORT TERM GOAL #5   Title demonstrate Lt knee AROM extension to lacking < or = to 5 degrees of extension to normalize gait pattern   Time 4   Period Weeks   Status New           PT Long Term Goals - 03/03/16 1607    PT LONG TERM GOAL #1   Title be independent in advanced HEP   Time 8   Period Weeks   Status New   PT LONG TERM GOAL #2   Title reduce FOTO to < or = to 39% limitation   Time 8   Period Weeks   Status New   PT LONG TERM GOAL #3   Title demonstrate symmetry with ambulation on level surface without device   Time 8   Period Weeks   Status New   PT LONG TERM GOAL #4   Title return to regular exercise routine and verbalize safe progression   Time 8   Period Weeks   Status New   PT LONG TERM GOAL #5   Title report a 60% reduction in Lt knee pain at night   Time 8   Period Weeks   Status New   Additional Long Term Goals   Additional Long Term Goals Yes   PT LONG TERM GOAL #6   Title improve LT knee strength to descend steps with step-over-step gait and use of 1 rail    Time 8   Period Weeks   Status New               Plan - 03/03/16 1652    Clinical Impression Statement Pt presents to PT s/p Lt TKA performed 04/28/16. Pt had home health PT for 4  weeks s/p surgery due to complications with readmission to the hospital.  Pt demonstrates gait abnormality, edema in the Lt knee, limited endurance for standing and walking tasks and pain with activity  s/p surgery. Pt will beneift from skilled PT for LT knee strenth, ROM and endurance with edema management to return to prior level of function.     Pt will benefit from skilled therapeutic intervention in order to improve on the following deficits Abnormal gait;Decreased range of motion;Difficulty walking;Pain;Increased edema;Decreased strength;Decreased scar mobility;Impaired flexibility;Decreased activity tolerance;Decreased endurance   Rehab Potential Good   PT Frequency 2x / week   PT Duration 8 weeks   PT Treatment/Interventions ADLs/Self Care Home Management;Cryotherapy;Electrical Stimulation;Moist Heat;Therapeutic exercise;Therapeutic activities;Functional mobility training;Stair training;Gait training;Ultrasound;Neuromuscular re-education;Patient/family education;Manual techniques;Vasopneumatic Device;Taping;Dry needling;Passive range of motion   PT Next Visit Plan Lt knee strength, proprioception, stair training, gait training and edema management   Consulted and Agree with Plan of Care Patient         Problem List Patient Active Problem List   Diagnosis Date Noted  . Hypoxia 02/14/2016  . Acute dyspnea 02/12/2016  . Dyspnea on exertion 02/12/2016  . Osteoarthritis of left knee 01/30/2016  . Status post total left knee replacement 01/30/2016   Lorrene Reid, PT 03/03/2016 5:02 PM  [facility] [address] [place], [place], [address] Phone: [phone]   Fax:  [phone]  Name: KALEEYA HANCOCK MRN: [id number] Date of Birth: 05-14-1961

## 2016-03-05 ENCOUNTER — Ambulatory Visit: Payer: BLUE CROSS/BLUE SHIELD | Admitting: Physical Therapy

## 2016-03-05 ENCOUNTER — Encounter: Payer: Self-pay | Admitting: Physical Therapy

## 2016-03-05 DIAGNOSIS — R269 Unspecified abnormalities of gait and mobility: Secondary | ICD-10-CM

## 2016-03-05 DIAGNOSIS — R600 Localized edema: Secondary | ICD-10-CM

## 2016-03-05 DIAGNOSIS — M25562 Pain in left knee: Secondary | ICD-10-CM | POA: Diagnosis not present

## 2016-03-05 DIAGNOSIS — M25662 Stiffness of left knee, not elsewhere classified: Secondary | ICD-10-CM

## 2016-03-05 NOTE — Therapy (Signed)
[facility] [address] [place], [place], [address] Phone: [phone]   Fax:  [phone]  Physical Therapy Treatment  Patient Details  Name: Patricia [name] MRN: [id number] Date of Birth: 09-11-1961 Referring Provider: Doneen Poisson, MD  Encounter Date: 03/05/2016      PT End of Session - 03/05/16 1132    Visit Number 2   Date for PT Re-Evaluation 04/28/16   PT Start Time 1100   PT Stop Time 1200   PT Time Calculation (min) 60 min   Activity Tolerance Patient tolerated treatment well   Behavior During Therapy [facility] for tasks assessed/performed      Past Medical History  Diagnosis Date  . Anxiety   . Dysplasia of cervix, low grade (CIN 1) 1987    cryo  . Hyperlipemia   . Arthritis     oa  . Loose body in joint     right knee  . Complication of anesthesia     does not like needles, has passsed out with needle insertion    Past Surgical History  Procedure Laterality Date  . Liposuction    . Knee surgery Left 12-12-14    lateral release and meniscus repair  . Eye surgery Bilateral 01-13-2005  . Uterine ablation  07-14-2007  . Augmentation mammaplasty      2009 ruptured silicone implants, replaced with saline 2009  . Joint replacement  12-13-2009    toe joint left big toe  . Total knee arthroplasty Left 01/30/2016    Procedure: LEFT TOTAL KNEE ARTHROPLASTY;  Surgeon: Kathryne Hitch, MD;  Location: [facility];  Service: Orthopedics;  Laterality: Left;    There were no vitals filed for this visit.  Visit Diagnosis:  Knee pain, acute, left  Edema of left lower extremity  Abnormality of gait  Knee stiffness, left      Subjective Assessment - 03/05/16 1129    Subjective Pt has no complain of pain during the day. pt takes Flexoril and Advil to sleep in the night but wakes up at 4am due to pain rated as 5/10, another Advil helps to find sleep again   Pertinent History Lt TKA: 01/30/16   Limitations Standing;Walking   How long can you stand comfortably? limited to long periods of standing with housework   How long can you walk comfortably? not been walking much in the community   Patient Stated Goals improve gait, improve Lt knee AROM, strength, endurance, edema, normalize giat   Currently in Pain? No/denies                         [facility] Adult PT Treatment/Exercise - 03/05/16 0001    Exercises   Exercises Knee/Hip   Knee/Hip Exercises: Aerobic   Stationary Bike Level 3 x 8 minutes   Knee/Hip Exercises: Machines for Strengthening   Total Gym Leg Press S#6 80# 3x10 B LE, 30# with Lt LE 3 x10 -unable to do 40#   Knee/Hip Exercises: Standing   Lateral Step Up Left;2 sets;10 reps;Hand Hold: 2;Step Height: 6"   Forward Step Up Left;2 sets;10 reps;Hand Hold: 2;Step Height: 6"   Step Down Right;2 sets;10 reps;Hand Hold: 2;Step Height: 6"  tapping down with Rt for strengthening left   Rebounder 3 directions x 1 min each with occasionally UE support   Other Standing Knee Exercises functional quadriceps with Rt UE into flexion x   Modalities   Modalities Vasopneumatic  Vasopneumatic   Number Minutes Vasopneumatic  10 minutes   Vasopnuematic Location  Knee   Vasopneumatic Pressure High   Vasopneumatic Temperature  3 snowflakes                  PT Short Term Goals - 03/03/16 1656    PT SHORT TERM GOAL #1   Title be independent in initial HEP   Time 4   Period Weeks   Status New   PT SHORT TERM GOAL #2   Title ascend steps with step-over-step gait and use of 1 rail   Time 4   Period Weeks   Status New   PT SHORT TERM GOAL #3   Title ambulate in the community for 1 hour for grocery shopping without limitation   Time 4   Period Weeks   Status New   PT SHORT TERM GOAL #4   Title report  a 30% reduction in Lt knee pain at the end of the day   Time 4   Period Weeks   Status New   PT SHORT TERM GOAL #5   Title demonstrate Lt knee AROM  extension to lacking < or = to 5 degrees of extension to normalize gait pattern   Time 4   Period Weeks   Status New           PT Long Term Goals - 03/03/16 1607    PT LONG TERM GOAL #1   Title be independent in advanced HEP   Time 8   Period Weeks   Status New   PT LONG TERM GOAL #2   Title reduce FOTO to < or = to 39% limitation   Time 8   Period Weeks   Status New   PT LONG TERM GOAL #3   Title demonstrate symmetry with ambulation on level surface without device   Time 8   Period Weeks   Status New   PT LONG TERM GOAL #4   Title return to regular exercise routine and verbalize safe progression   Time 8   Period Weeks   Status New   PT LONG TERM GOAL #5   Title report a 60% reduction in Lt knee pain at night   Time 8   Period Weeks   Status New   Additional Long Term Goals   Additional Long Term Goals Yes   PT LONG TERM GOAL #6   Title improve LT knee strength to descend steps with step-over-step gait and use of 1 rail    Time 8   Period Weeks   Status New               Plan - 03/05/16 1134    Clinical Impression Statement Pt with good healing incision, but swelling and warmth Lt knee. Pt with functional weakness in quadriceps, limited endurance for standing and walking tasks. Pt will benefit from skilled PT to increase strength and endurance and edema managment .   Pt will benefit from skilled therapeutic intervention in order to improve on the following deficits Abnormal gait;Decreased range of motion;Difficulty walking;Pain;Increased edema;Decreased strength;Decreased scar mobility;Impaired flexibility;Decreased activity tolerance;Decreased endurance   Rehab Potential Good   PT Frequency 2x / week   PT Duration 8 weeks   PT Treatment/Interventions ADLs/Self Care Home Management;Cryotherapy;Electrical Stimulation;Moist Heat;Therapeutic exercise;Therapeutic activities;Functional mobility training;Stair training;Gait training;Ultrasound;Neuromuscular  re-education;Patient/family education;Manual techniques;Vasopneumatic Device;Taping;Dry needling;Passive range of motion   PT Next Visit Plan Lt knee strength, proprioception, stair training, gait training and edema management  Consulted and Agree with Plan of Care Patient        Problem List Patient Active Problem List   Diagnosis Date Noted  . Hypoxia 02/14/2016  . Acute dyspnea 02/12/2016  . Dyspnea on exertion 02/12/2016  . Osteoarthritis of left knee 01/30/2016  . Status post total left knee replacement 01/30/2016    NAUMANN-HOUEGNIFIO,Trinty Marken PTA 03/05/2016, 12:04 PM  [facility] [address] [place], [place], [address] Phone: [phone]   Fax:  [phone]  Name: Patricia [name] MRN: [id number] Date of Birth: November 25, 1961

## 2016-03-08 ENCOUNTER — Ambulatory Visit: Payer: BLUE CROSS/BLUE SHIELD

## 2016-03-08 DIAGNOSIS — M25562 Pain in left knee: Secondary | ICD-10-CM

## 2016-03-08 DIAGNOSIS — R269 Unspecified abnormalities of gait and mobility: Secondary | ICD-10-CM

## 2016-03-08 DIAGNOSIS — R600 Localized edema: Secondary | ICD-10-CM

## 2016-03-08 DIAGNOSIS — M25662 Stiffness of left knee, not elsewhere classified: Secondary | ICD-10-CM

## 2016-03-08 NOTE — Therapy (Signed)
[facility] [address], [address] [place], [place], [address] Phone: [phone]   Fax:  [phone]  Physical Therapy Treatment  Patient Details  Name: Patricia [name] MRN: [id number] Date of Birth: 04-19-1961 Referring Provider: Doneen Poisson, MD  Encounter Date: 03/08/2016      PT End of Session - 03/08/16 1611    Visit Number 3   Date for PT Re-Evaluation 04/28/16   PT Start Time 1526   PT Stop Time 1625   PT Time Calculation (min) 59 min   Activity Tolerance Patient tolerated treatment well   Behavior During Therapy [facility] for tasks assessed/performed      Past Medical History  Diagnosis Date  . Anxiety   . Dysplasia of cervix, low grade (CIN 1) 1987    cryo  . Hyperlipemia   . Arthritis     oa  . Loose body in joint     right knee  . Complication of anesthesia     does not like needles, has passsed out with needle insertion    Past Surgical History  Procedure Laterality Date  . Liposuction    . Knee surgery Left 12-12-14    lateral release and meniscus repair  . Eye surgery Bilateral 01-13-2005  . Uterine ablation  07-14-2007  . Augmentation mammaplasty      2009 ruptured silicone implants, replaced with saline 2009  . Joint replacement  12-13-2009    toe joint left big toe  . Total knee arthroplasty Left 01/30/2016    Procedure: LEFT TOTAL KNEE ARTHROPLASTY;  Surgeon: Kathryne Hitch, MD;  Location: [facility];  Service: Orthopedics;  Laterality: Left;    There were no vitals filed for this visit.  Visit Diagnosis:  Knee pain, acute, left  Edema of left lower extremity  Abnormality of gait  Knee stiffness, left      Subjective Assessment - 03/08/16 1538    Subjective Sore and incresaed swelling after last session.  It wasn't too bad and has resolved now.     Pertinent History Lt TKA: 01/30/16   Patient Stated Goals improve gait, improve Lt knee AROM, strength, endurance, edema,  normalize giat   Currently in Pain? Yes   Pain Score 1    Pain Location Knee   Pain Orientation Left   Pain Descriptors / Indicators Tightness;Aching   Pain Type Surgical pain   Pain Onset More than a month ago   Pain Frequency Intermittent   Aggravating Factors  too much exercise, sleep at night   Pain Relieving Factors ice, Advil, rest/elevation                         [facility] Adult PT Treatment/Exercise - 03/08/16 0001    Knee/Hip Exercises: Aerobic   Stationary Bike Level 3 x 8 minutes   Knee/Hip Exercises: Machines for Strengthening   Total Gym Leg Press S#6 75# 3x10 B LE, 25# with Lt LE 3 x10   Knee/Hip Exercises: Standing   Lateral Step Up Left;2 sets;10 reps;Hand Hold: 2;Step Height: 6"   Forward Step Up Left;2 sets;10 reps;Hand Hold: 2;Step Height: 6"   Rebounder 3 directions x 1 min each with occasionally UE support   Knee/Hip Exercises: Seated   Long Arc Quad Left;2 sets;10 reps   Modalities   Modalities Vasopneumatic   Vasopneumatic   Number Minutes Vasopneumatic  15 minutes   Vasopnuematic Location  Knee   Vasopneumatic Pressure High   Vasopneumatic  Temperature  3 snowflakes   Manual Therapy   Manual Therapy Edema management;Myofascial release;Soft tissue mobilization   Soft tissue mobilization scar mobs, soft tissue elongation and release to Lt distal quad and hamstring.  Scar mobs.                   PT Short Term Goals - 03/08/16 1545    PT SHORT TERM GOAL #1   Title be independent in initial HEP   Status Achieved   PT SHORT TERM GOAL #2   Title ascend steps with step-over-step gait and use of 1 rail   Time 4   Period Weeks   Status On-going   PT SHORT TERM GOAL #3   Title ambulate in the community for 1 hour for grocery shopping without limitation   Time 4   Period Weeks   Status On-going   PT SHORT TERM GOAL #4   Title report  a 30% reduction in Lt knee pain at the end of the day   Time 4   Period Weeks   Status On-going            PT Long Term Goals - 03/03/16 1607    PT LONG TERM GOAL #1   Title be independent in advanced HEP   Time 8   Period Weeks   Status New   PT LONG TERM GOAL #2   Title reduce FOTO to < or = to 39% limitation   Time 8   Period Weeks   Status New   PT LONG TERM GOAL #3   Title demonstrate symmetry with ambulation on level surface without device   Time 8   Period Weeks   Status New   PT LONG TERM GOAL #4   Title return to regular exercise routine and verbalize safe progression   Time 8   Period Weeks   Status New   PT LONG TERM GOAL #5   Title report a 60% reduction in Lt knee pain at night   Time 8   Period Weeks   Status New   Additional Long Term Goals   Additional Long Term Goals Yes   PT LONG TERM GOAL #6   Title improve LT knee strength to descend steps with step-over-step gait and use of 1 rail    Time 8   Period Weeks   Status New               Plan - 03/08/16 1546    Clinical Impression Statement Pt with a moderate increase in edema of Lt knee after last treatment.  Pt has walked in the community for grocery shopping and reports fatigue and increased edema after this activity.  Pt with edema and limited strength and edema s/p Lt TKA.  Pt will benefit from skilled PT for edema management, strength and AROM progression.     Pt will benefit from skilled therapeutic intervention in order to improve on the following deficits Abnormal gait;Decreased range of motion;Difficulty walking;Pain;Increased edema;Decreased strength;Decreased scar mobility;Impaired flexibility;Decreased activity tolerance;Decreased endurance   Rehab Potential Good   PT Frequency 2x / week   PT Duration 8 weeks   PT Treatment/Interventions ADLs/Self Care Home Management;Cryotherapy;Electrical Stimulation;Moist Heat;Therapeutic exercise;Therapeutic activities;Functional mobility training;Stair training;Gait training;Ultrasound;Neuromuscular re-education;Patient/family  education;Manual techniques;Vasopneumatic Device;Taping;Dry needling;Passive range of motion   PT Next Visit Plan Lt knee strength, proprioception, stair training, gait training and edema management   Consulted and Agree with Plan of Care Patient  Problem List Patient Active Problem List   Diagnosis Date Noted  . Hypoxia 02/14/2016  . Acute dyspnea 02/12/2016  . Dyspnea on exertion 02/12/2016  . Osteoarthritis of left knee 01/30/2016  . Status post total left knee replacement 01/30/2016   Lorrene Reid, PT 03/08/2016 4:14 PM  [facility] [address] [place], [place], [address] Phone: [phone]   Fax:  [phone]  Name: Patricia [name] MRN: [id number] Date of Birth: 09-Apr-1961

## 2016-03-10 ENCOUNTER — Ambulatory Visit: Payer: BLUE CROSS/BLUE SHIELD | Admitting: Physical Therapy

## 2016-03-10 ENCOUNTER — Encounter: Payer: Self-pay | Admitting: Physical Therapy

## 2016-03-10 DIAGNOSIS — M25562 Pain in left knee: Secondary | ICD-10-CM

## 2016-03-10 DIAGNOSIS — M25662 Stiffness of left knee, not elsewhere classified: Secondary | ICD-10-CM

## 2016-03-10 DIAGNOSIS — R600 Localized edema: Secondary | ICD-10-CM

## 2016-03-10 DIAGNOSIS — R269 Unspecified abnormalities of gait and mobility: Secondary | ICD-10-CM

## 2016-03-10 NOTE — Therapy (Signed)
[facility] [address], [address] [place], [place], [address] Phone: [phone]   Fax:  [phone]  Physical Therapy Treatment  Patient Details  Name: Patricia [name] MRN: [id number] Date of Birth: 08/07/1961 Referring Provider: Doneen PoissonBlackman, Christopher, MD  Encounter Date: 03/10/2016      PT End of Session - 03/10/16 1619    Visit Number 4   Date for PT Re-Evaluation 04/28/16   PT Start Time 1615   PT Stop Time 1705   PT Time Calculation (min) 50 min   Activity Tolerance Patient tolerated treatment well   Behavior [facility] Therapy [facility] for tasks assessed/performed      Past Medical History  Diagnosis Date  . Anxiety   . Dysplasia of cervix, low grade (CIN 1) 1987    cryo  . Hyperlipemia   . Arthritis     oa  . Loose body in joint     right knee  . Complication of anesthesia     does not like needles, has passsed out with needle insertion    Past Surgical History  Procedure Laterality Date  . Liposuction    . Knee surgery Left 12-12-14    lateral release and meniscus repair  . Eye surgery Bilateral 01-13-2005  . Uterine ablation  07-14-2007  . Augmentation mammaplasty      2009 ruptured silicone implants, replaced with saline 2009  . Joint replacement  12-13-2009    toe joint left big toe  . Total knee arthroplasty Left 01/30/2016    Procedure: LEFT TOTAL KNEE ARTHROPLASTY;  Surgeon: Kathryne Hitchhristopher Y Blackman, MD;  Location: [facility];  Service: Orthopedics;  Laterality: Left;    There were no vitals filed for this visit.  Visit Diagnosis:  Knee pain, acute, left  Knee stiffness, left  Edema of left lower extremity  Abnormality of gait      Subjective Assessment - 03/10/16 1617    Subjective Reducing the work last session really helped.    Currently in Pain? Yes   Pain Score 1    Pain Location Knee   Pain Orientation Left   Pain Descriptors / Indicators Tightness;Dull   Aggravating Factors  Overdoing  it    Pain Relieving Factors Ice, exercise   Multiple Pain Sites No            [facility] PT Assessment - 03/10/16 0001    AROM   Left Knee Extension 3   Left Knee Flexion 126                     [facility] Adult PT Treatment/Exercise - 03/10/16 0001    Knee/Hip Exercises: Stretches   Active Hamstring Stretch Left;2 reps;20 seconds   Knee/Hip Exercises: Aerobic   Stationary Bike L 3 x 10 min   Knee/Hip Exercises: Machines for Strengthening   Total Gym Leg Press S#6 75# 3x10 B LE, 25# with Lt LE 3 x10   Knee/Hip Exercises: Standing   Hip Abduction Stengthening;Right;1 set;10 reps  Pt will do at home in sidelying fr HEP   Lateral Step Up --   Forward Step Up Left;2 sets;10 reps;Hand Hold: 2;Step Height: 4"   Rebounder 3 directions x 1 min each with occasionally UE support   Vasopneumatic   Number Minutes Vasopneumatic  10 minutes   Vasopnuematic Location  Knee   Vasopneumatic Pressure High   Vasopneumatic Temperature  3 snowflakes  PT Short Term Goals - 03/10/16 1649    PT SHORT TERM GOAL #2   Title ascend steps with step-over-step gait and use of 1 rail   Time 4   Period Weeks   Status On-going  Pt uses a propulsive motion to ascend   PT SHORT TERM GOAL #3   Title ambulate in the community for 1 hour for grocery shopping without limitation   Time 4   Period Weeks   Status Achieved   PT SHORT TERM GOAL #5   Title demonstrate Lt knee AROM extension to lacking < or = to 5 degrees of extension to normalize gait pattern   Time 4   Period Weeks   Status Achieved           PT Long Term Goals - 03/03/16 1607    PT LONG TERM GOAL #1   Title be independent in advanced HEP   Time 8   Period Weeks   Status New   PT LONG TERM GOAL #2   Title reduce FOTO to < or = to 39% limitation   Time 8   Period Weeks   Status New   PT LONG TERM GOAL #3   Title demonstrate symmetry with ambulation on level surface without device   Time 8    Period Weeks   Status New   PT LONG TERM GOAL #4   Title return to regular exercise routine and verbalize safe progression   Time 8   Period Weeks   Status New   PT LONG TERM GOAL #5   Title report a 60% reduction in Lt knee pain at night   Time 8   Period Weeks   Status New   Additional Long Term Goals   Additional Long Term Goals Yes   PT LONG TERM GOAL #6   Title improve LT knee strength to descend steps with step-over-step gait and use of 1 rail    Time 8   Period Weeks   Status New               Plan - 03/10/16 1628    Clinical Impression Statement Pt with weakness that presents in her LT hip abductors making it difficult to have good alignment when doing stairs or steps ups. She is working on this. Gait is Trendelenberg in nature. Pain not a significant factor. ROM is excellent!   Pt will benefit from skilled therapeutic intervention in order to improve on the following deficits Abnormal gait;Decreased range of motion;Difficulty walking;Pain;Increased edema;Decreased strength;Decreased scar mobility;Impaired flexibility;Decreased activity tolerance;Decreased endurance   Rehab Potential Good   PT Frequency 2x / week   PT Treatment/Interventions ADLs/Self Care Home Management;Cryotherapy;Electrical Stimulation;Moist Heat;Therapeutic exercise;Therapeutic activities;Functional mobility training;Stair training;Gait training;Ultrasound;Neuromuscular re-education;Patient/family education;Manual techniques;Vasopneumatic Device;Taping;Dry needling;Passive range of motion   PT Next Visit Plan To MD tomorrow   Consulted and Agree with Plan of Care Patient        Problem List Patient Active Problem List   Diagnosis Date Noted  . Hypoxia 02/14/2016  . Acute dyspnea 02/12/2016  . Dyspnea on exertion 02/12/2016  . Osteoarthritis of left knee 01/30/2016  . Status post total left knee replacement 01/30/2016    COCHRAN,JENNIFER, PTA 03/10/2016, 4:52 PM  [facility] [address], [address] [place], [place], [address] Phone: [phone]   Fax:  [phone]  Name: Patricia [name] MRN: [id number] Date of Birth: [birth year]

## 2016-03-12 ENCOUNTER — Encounter: Payer: Self-pay | Admitting: Physical Therapy

## 2016-03-12 ENCOUNTER — Ambulatory Visit: Payer: BLUE CROSS/BLUE SHIELD | Admitting: Physical Therapy

## 2016-03-12 DIAGNOSIS — M25662 Stiffness of left knee, not elsewhere classified: Secondary | ICD-10-CM

## 2016-03-12 DIAGNOSIS — R269 Unspecified abnormalities of gait and mobility: Secondary | ICD-10-CM

## 2016-03-12 DIAGNOSIS — M25562 Pain in left knee: Secondary | ICD-10-CM

## 2016-03-12 DIAGNOSIS — R600 Localized edema: Secondary | ICD-10-CM

## 2016-03-12 NOTE — Patient Instructions (Signed)
Step ups, downs (go SLOWLY), and side steps.   Single leg balance: Sprinkle this throughout your day. Min of 10 seconds up to 30 seconds.   Sidelying leg lifts. SLOW, long leg, not too high. 3x 10-20 Engage your core throughout...lengthen that leg long..  Stationary bike daily. Min 5 minutes building up to 20-30 min.  Leg press at gym: do 3 x10 both legs with heavier wt, then reduce weight for single leg.

## 2016-03-12 NOTE — Therapy (Signed)
[facility] [address], [address] [place], [place], [address] Phone: [phone]   Fax:  [phone]  Physical Therapy Treatment  Patient Details  Name: BRITTANNI CARIKER MRN: [id number] Date of Birth: [birth year] Referring Provider: Doneen Poisson, MD  Encounter Date: 03/12/2016      PT End of Session - 03/12/16 1111    Visit Number 5   Date for PT Re-Evaluation 04/28/16   PT Start Time 1100   PT Stop Time 1150   PT Time Calculation (min) 50 min   Activity Tolerance Patient tolerated treatment well   Behavior During Therapy [facility] for tasks assessed/performed      Past Medical History  Diagnosis Date  . Anxiety   . Dysplasia of cervix, low grade (CIN 1) 1987    cryo  . Hyperlipemia   . Arthritis     oa  . Loose body in joint     right knee  . Complication of anesthesia     does not like needles, has passsed out with needle insertion    Past Surgical History  Procedure Laterality Date  . Liposuction    . Knee surgery Left 12-12-14    lateral release and meniscus repair  . Eye surgery Bilateral 01-13-2005  . Uterine ablation  07-14-2007  . Augmentation mammaplasty      2009 ruptured silicone implants, replaced with saline 2009  . Joint replacement  12-13-2009    toe joint left big toe  . Total knee arthroplasty Left 01/30/2016    Procedure: LEFT TOTAL KNEE ARTHROPLASTY;  Surgeon: Kathryne Hitch, MD;  Location: [facility];  Service: Orthopedics;  Laterality: Left;    There were no vitals filed for this visit.  Visit Diagnosis:  Abnormality of gait  Knee stiffness, left  Edema of left lower extremity  Knee pain, acute, left      Subjective Assessment - 03/12/16 1109    Subjective MD super pleased with pt's progress. Pt would like to wrap up PT innext 1-3 visits. This is mainly due to work schedule and pt's high motivation levels.    Currently in Pain? Yes   Pain Score 1    Pain Location Knee    Pain Orientation Left   Pain Descriptors / Indicators Dull   Multiple Pain Sites No                         [facility] Adult PT Treatment/Exercise - 03/12/16 0001    Knee/Hip Exercises: Aerobic   Stationary Bike L3 x10 min   Knee/Hip Exercises: Machines for Strengthening   Total Gym Leg Press Seat 6 80# bil 30x, 25# x 10, 30# 2x10   Knee/Hip Exercises: Standing   SLS --  3 x 10 seconds   Vasopneumatic   Number Minutes Vasopneumatic  15 minutes   Vasopnuematic Location  Knee   Vasopneumatic Pressure High   Vasopneumatic Temperature  3 snowflakes                PT Education - 03/12/16 1134    Education provided Yes   Education Details Final HEP and how to progress at the gym   Person(s) Educated Patient   Methods Explanation   Comprehension Verbalized understanding          PT Short Term Goals - 03/10/16 1649    PT SHORT TERM GOAL #2   Title ascend steps with step-over-step gait and use of 1 rail  Time 4   Period Weeks   Status On-going  Pt uses a propulsive motion to ascend   PT SHORT TERM GOAL #3   Title ambulate in the community for 1 hour for grocery shopping without limitation   Time 4   Period Weeks   Status Achieved   PT SHORT TERM GOAL #5   Title demonstrate Lt knee AROM extension to lacking < or = to 5 degrees of extension to normalize gait pattern   Time 4   Period Weeks   Status Achieved           PT Long Term Goals - 03/03/16 1607    PT LONG TERM GOAL #1   Title be independent in advanced HEP   Time 8   Period Weeks   Status New   PT LONG TERM GOAL #2   Title reduce FOTO to < or = to 39% limitation   Time 8   Period Weeks   Status New   PT LONG TERM GOAL #3   Title demonstrate symmetry with ambulation on level surface without device   Time 8   Period Weeks   Status New   PT LONG TERM GOAL #4   Title return to regular exercise routine and verbalize safe progression   Time 8   Period Weeks   Status New   PT  LONG TERM GOAL #5   Title report a 60% reduction in Lt knee pain at night   Time 8   Period Weeks   Status New   Additional Long Term Goals   Additional Long Term Goals Yes   PT LONG TERM GOAL #6   Title improve LT knee strength to descend steps with step-over-step gait and use of 1 rail    Time 8   Period Weeks   Status New               Plan - 03/12/16 1128    Clinical Impression Statement pt with excellent report from MD this week. She reports work schedule is about to get crazy next week so she will focus on her HEP mainly with one follow up visit next week. Pt has good understanding of LE alignment and progression of HEP.    Pt will benefit from skilled therapeutic intervention in order to improve on the following deficits Abnormal gait;Decreased range of motion;Difficulty walking;Pain;Increased edema;Decreased strength;Decreased scar mobility;Impaired flexibility;Decreased activity tolerance;Decreased endurance   Rehab Potential Good   PT Frequency 2x / week   PT Duration 8 weeks   PT Treatment/Interventions ADLs/Self Care Home Management;Cryotherapy;Electrical Stimulation;Moist Heat;Therapeutic exercise;Therapeutic activities;Functional mobility training;Stair training;Gait training;Ultrasound;Neuromuscular re-education;Patient/family education;Manual techniques;Vasopneumatic Device;Taping;Dry needling;Passive range of motion   PT Next Visit Plan Possible DC next week.         Problem List Patient Active Problem List   Diagnosis Date Noted  . Hypoxia 02/14/2016  . Acute dyspnea 02/12/2016  . Dyspnea on exertion 02/12/2016  . Osteoarthritis of left knee 01/30/2016  . Status post total left knee replacement 01/30/2016    [facility] 03/12/2016, 11:37 AM  [facility] [address], [address] [place], [place], [address] Phone: [phone]   Fax:  [phone]  Name: Beverely LowKimberly C Sasso MRN:  [id number] Date of Birth: 04/29/1961

## 2016-03-15 ENCOUNTER — Ambulatory Visit: Payer: BLUE CROSS/BLUE SHIELD | Admitting: Physical Therapy

## 2016-03-17 ENCOUNTER — Ambulatory Visit: Payer: BLUE CROSS/BLUE SHIELD | Attending: Orthopaedic Surgery | Admitting: Physical Therapy

## 2016-03-17 ENCOUNTER — Encounter: Payer: Self-pay | Admitting: Physical Therapy

## 2016-03-17 DIAGNOSIS — R269 Unspecified abnormalities of gait and mobility: Secondary | ICD-10-CM

## 2016-03-17 DIAGNOSIS — R600 Localized edema: Secondary | ICD-10-CM | POA: Diagnosis not present

## 2016-03-17 DIAGNOSIS — M25562 Pain in left knee: Secondary | ICD-10-CM | POA: Diagnosis not present

## 2016-03-17 DIAGNOSIS — M25662 Stiffness of left knee, not elsewhere classified: Secondary | ICD-10-CM

## 2016-03-17 NOTE — Therapy (Signed)
[facility] [address], [place], [place], [address] Phone: [phone]   Fax:  [phone]  Physical Therapy Treatment  Patient Details  Name: Patricia [name] MRN: [id number] Date of Birth: 1961/08/13 Referring Provider: Jean Rosenthal, MD  Encounter Date: 03/17/2016      PT End of Session - 03/17/16 1615    Visit Number 6   Date for PT Re-Evaluation 04/28/16   PT Start Time 1601   PT Stop Time 1655   PT Time Calculation (min) 45 min   Activity Tolerance Patient tolerated treatment well   Behavior During Therapy [facility] for tasks assessed/performed      Past Medical History  Diagnosis Date  . Anxiety   . Dysplasia of cervix, low grade (CIN 1) 1987    cryo  . Hyperlipemia   . Arthritis     oa  . Loose body in joint     right knee  . Complication of anesthesia     does not like needles, has passsed out with needle insertion    Past Surgical History  Procedure Laterality Date  . Liposuction    . Knee surgery Left 12-12-14    lateral release and meniscus repair  . Eye surgery Bilateral 01-13-2005  . Uterine ablation  07-14-2007  . Augmentation mammaplasty      0932 ruptured silicone implants, replaced with saline 2009  . Joint replacement  12-13-2009    toe joint left big toe  . Total knee arthroplasty Left 01/30/2016    Procedure: LEFT TOTAL KNEE ARTHROPLASTY;  Surgeon: Mcarthur Rossetti, MD;  Location: [facility];  Service: Orthopedics;  Laterality: Left;    There were no vitals filed for this visit.  Visit Diagnosis:  Edema of left lower extremity  Knee stiffness, left  Knee pain, acute, left  Abnormality of gait      Subjective Assessment - 03/17/16 1614    Subjective Back to work. Knee sore and swollen, transition has been hard. She is still wanting today to be her last visit.    Currently in Pain? Yes   Pain Score 5    Pain Location Knee   Pain Orientation Left   Pain  Descriptors / Indicators Sore;Tightness   Aggravating Factors  Going back to work   Pain Relieving Factors ice   Multiple Pain Sites No            [facility] PT Assessment - 03/17/16 0001    Observation/Other Assessments   Focus on Therapeutic Outcomes (FOTO)  32% limited   AROM   Left Knee Extension 3   Left Knee Flexion 126   Strength   Left Knee Flexion 4+/5  Knee swollen post work today   Left Knee Extension 4+/5                     [facility] Adult PT Treatment/Exercise - 03/17/16 0001    Knee/Hip Exercises: Stretches   Active Hamstring Stretch Left;3 reps;20 seconds   Piriformis Stretch Left;3 reps;20 seconds   Other Knee/Hip Stretches Lateral hip stretch Lt 3x 20 sec   Knee/Hip Exercises: Aerobic   Stationary Bike L3 x 10 min   Knee/Hip Exercises: Machines for Strengthening   Total Gym Leg Press --  Held today secondary to edema   Knee/Hip Exercises: Sidelying   Hip ABduction Strengthening;Left;2 sets;10 reps  yellow band   Vasopneumatic   Number Minutes Vasopneumatic  20 minutes   Vasopnuematic Location  Knee  Vasopneumatic Pressure High   Vasopneumatic Temperature  3 snowflakes                PT Education - 03/17/16 0102    Education provided Yes   Education Details Yellow band given for hip abduction resistance at home   Person(s) Educated Patient   Methods Demonstration;Explanation;Tactile cues;Verbal cues;Handout  Band given   Comprehension Verbalized understanding;Returned demonstration          PT Short Term Goals - 03/17/16 1616    PT SHORT TERM GOAL #2   Title ascend steps with step-over-step gait and use of 1 rail   Time 4   Period Weeks   Status Achieved  Can do but slowly   PT SHORT TERM GOAL #4   Title report  a 30% reduction in Lt knee pain at the end of the day   Time 4   Period Weeks   Status Not Met  Not met only because she has returned to work this week., otherwise it is better.            PT Long Term  Goals - 03/17/16 1618    PT LONG TERM GOAL #1   Title be independent in advanced HEP   Time 8   Period Weeks   Status Achieved   PT LONG TERM GOAL #2   Title reduce FOTO to < or = to 39% limitation   Time 8   Period Weeks   Status Achieved  32%   PT LONG TERM GOAL #3   Title demonstrate symmetry with ambulation on level surface without device   Time 8   Period Weeks   Status Not Met  trendenlenberg like gait   PT LONG TERM GOAL #4   Title return to regular exercise routine and verbalize safe progression   Time 8   Period Weeks   Status Achieved   PT LONG TERM GOAL #5   Title report a 60% reduction in Lt knee pain at night   Time 8   Period Weeks   Status Not Met  About the same   PT LONG TERM GOAL #6   Title improve LT knee strength to descend steps with step-over-step gait and use of 1 rail    Time 8   Period Weeks   Status Achieved  Just slowly               Plan - 03/17/16 1637    Clinical Impression Statement Pt has returned to work. Knee is more swollen and sore, but still would like to continue on her own with home exercises and the gym. see ROM & strength iin the chart.    Pt will benefit from skilled therapeutic intervention in order to improve on the following deficits Abnormal gait;Decreased range of motion;Difficulty walking;Pain;Increased edema;Decreased strength;Decreased scar mobility;Impaired flexibility;Decreased activity tolerance;Decreased endurance   Rehab Potential Good   PT Frequency 2x / week   PT Duration 8 weeks   PT Treatment/Interventions ADLs/Self Care Home Management;Cryotherapy;Electrical Stimulation;Moist Heat;Therapeutic exercise;Therapeutic activities;Functional mobility training;Stair training;Gait training;Ultrasound;Neuromuscular re-education;Patient/family education;Manual techniques;Vasopneumatic Device;Taping;Dry needling;Passive range of motion   PT Next Visit Plan Discharge   Consulted and Agree with Plan of Care Patient         Problem List Patient Active Problem List   Diagnosis Date Noted  . Hypoxia 02/14/2016  . Acute dyspnea 02/12/2016  . Dyspnea on exertion 02/12/2016  . Osteoarthritis of left knee 01/30/2016  . Status post total left knee replacement 01/30/2016  Myrene Galas, PTA 03/17/2016 4:39 PM PHYSICAL THERAPY DISCHARGE SUMMARY  Visits from Start of Care: 6  Current functional level related to goals / functional outcomes: See above for current status.  Pt requested D/C to gym exercises.    Remaining deficits: Antalgic gait, Lt knee edema, stiffness and weakness s/p TKA.  Pt has HEP and gym exercises in place for continued gains.     Education / Equipment: HEP, gym exercises Plan: Patient agrees to discharge.  Patient goals were partially met. Patient is being discharged due to being pleased with the current functional level.  ?????   Sigurd Sos, PT 03/17/2016 4:50 PM   [facility] [address], [place], [place], [address] Phone: [phone]   Fax:  [phone]  Name: ARALY KAAS MRN: [id number] Date of Birth: Sep 20, 1961

## 2016-03-19 ENCOUNTER — Encounter: Payer: BLUE CROSS/BLUE SHIELD | Admitting: Physical Therapy

## 2016-03-22 ENCOUNTER — Encounter: Payer: BLUE CROSS/BLUE SHIELD | Admitting: Physical Therapy

## 2016-03-29 ENCOUNTER — Encounter: Payer: BLUE CROSS/BLUE SHIELD | Admitting: Physical Therapy

## 2016-04-02 ENCOUNTER — Encounter: Payer: BLUE CROSS/BLUE SHIELD | Admitting: Physical Therapy

## 2016-05-11 DIAGNOSIS — E559 Vitamin D deficiency, unspecified: Secondary | ICD-10-CM | POA: Diagnosis not present

## 2016-05-11 DIAGNOSIS — E7800 Pure hypercholesterolemia, unspecified: Secondary | ICD-10-CM | POA: Diagnosis not present

## 2016-05-11 DIAGNOSIS — Z0000 Encounter for general adult medical examination without abnormal findings: Secondary | ICD-10-CM | POA: Diagnosis not present

## 2016-05-14 DIAGNOSIS — Z0000 Encounter for general adult medical examination without abnormal findings: Secondary | ICD-10-CM | POA: Diagnosis not present

## 2016-06-10 ENCOUNTER — Telehealth: Payer: Self-pay | Admitting: *Deleted

## 2016-06-10 NOTE — Telephone Encounter (Signed)
Spoke with patient. Patient reports ever since 06/05/2016 she has been experiencing external vaginal itching. Denies any vaginal discharge, redness, or swelling. Is having mild cramping. Denies any urinary symptoms, fever, or chills. Patient is going out of town this weekend and would like to be seen before then. Appointment scheduled for tomorrow 06/11/2016 at 10:15 am with Ria CommentPatricia Grubb, FNP. Patient is agreeable to date, time, and to see another provider.  Routing to provider for final review. Patient agreeable to disposition. Will close encounter.

## 2016-06-10 NOTE — Telephone Encounter (Signed)
Patient thinks she has a yeast infection or BV and is leaving for the beach tomorrow. She is requesting to be seen today-eh

## 2016-06-11 ENCOUNTER — Ambulatory Visit (INDEPENDENT_AMBULATORY_CARE_PROVIDER_SITE_OTHER): Payer: BLUE CROSS/BLUE SHIELD | Admitting: Nurse Practitioner

## 2016-06-11 ENCOUNTER — Encounter: Payer: Self-pay | Admitting: Nurse Practitioner

## 2016-06-11 VITALS — BP 130/84 | HR 60 | Ht 62.25 in | Wt 158.0 lb

## 2016-06-11 DIAGNOSIS — N760 Acute vaginitis: Secondary | ICD-10-CM | POA: Diagnosis not present

## 2016-06-11 DIAGNOSIS — R300 Dysuria: Secondary | ICD-10-CM

## 2016-06-11 LAB — POCT URINALYSIS DIPSTICK
Bilirubin, UA: NEGATIVE
Glucose, UA: NEGATIVE
Ketones, UA: NEGATIVE
Leukocytes, UA: NEGATIVE
Nitrite, UA: NEGATIVE
PROTEIN UA: NEGATIVE
RBC UA: NEGATIVE
UROBILINOGEN UA: NEGATIVE
pH, UA: 5

## 2016-06-11 MED ORDER — CIPROFLOXACIN HCL 500 MG PO TABS: 500.0000 mg | ORAL_TABLET | Freq: Two times a day (BID) | ORAL | Status: DC

## 2016-06-11 MED ORDER — FLUCONAZOLE 150 MG PO TABS: 150.0000 mg | ORAL_TABLET | Freq: Once | ORAL | Status: DC

## 2016-06-11 MED ORDER — NYSTATIN-TRIAMCINOLONE 100000-0.1 UNIT/GM-% EX OINT: 1.0000 "application " | TOPICAL_OINTMENT | Freq: Two times a day (BID) | CUTANEOUS | Status: DC

## 2016-06-11 NOTE — Progress Notes (Signed)
Encounter reviewed by Dr. Brook Amundson C. Silva.  

## 2016-06-11 NOTE — Progress Notes (Signed)
55 y.o. Married Caucasian female G2P2002 here with complaint of vaginal symptoms of itching and irritation at the vulva. Describes discharge as none. Onset of symptoms 4-5 days ago. Denies new personal products or vaginal dryness.  She did use Mycolog cream with some relief.  States she had a knee replacement 01/2016.  She then for went for a dental apt a few days ago and had to take a high dose of PCN.  Afterwards is when she noted the symptoms.  She still has other dental upcoming.   No STD concerns. Urinary symptoms frequency and dysuria but may be due to external symptoms . Contraception is postmenopausal.   [place]   O:  Healthy female WDWN Affect: normal, orientation x 3  Exam: no distress Abdomen: soft and non tender Lymph node: no enlargement or tenderness Pelvic exam: External genital: normal female with what appears external scratching and irritation BUS: negative with a prolapsed urethra - slight tender Vagina: thin clear to white discharge noted.  Affirm taken.  Urine chemstrip is negative.    A:  R/O Vaginitis - most likely yeast given the external symptoms  R/O UTI / uarthritis   CP: Discussed findings of vaginitis and etiology. Discussed Aveeno or baking soda sitz bath for comfort. Avoid moist clothes or pads for extended period of time. If working out in gym clothes or swim suits for long periods of time change underwear or bottoms of swimsuit if possible. Olive Oil/Coconut Oil use for skin protection prior to activity can be used to external skin.  Rx: will go ahead and treat with Diflucan given her history  RX: Cipro 500 mg BID  -pending the culture  Rf: Mycolog cream  Follow with Affirm  RV prn

## 2016-06-11 NOTE — Patient Instructions (Signed)

## 2016-06-12 LAB — URINE CULTURE
Colony Count: NO GROWTH
Organism ID, Bacteria: NO GROWTH

## 2016-06-12 LAB — WET PREP BY MOLECULAR PROBE
CANDIDA SPECIES: NEGATIVE
GARDNERELLA VAGINALIS: NEGATIVE
TRICHOMONAS VAG: NEGATIVE

## 2016-07-10 IMAGING — CT CT ANGIO CHEST
2 of 6 series · 19 of 36 positions shown · IV contrast (omnipaque)
Comparison: Current chest radiographs.

CLINICAL DATA: Pt c/o SOB, dizziness on exertion or standing sudden
onset today. Pt had left TKR 01/30/16. On xarelto. Sent by PCP to r/o
PE.

EXAM:
CT ANGIOGRAPHY CHEST WITH CONTRAST
TECHNIQUE: Multidetector CT imaging of the chest was performed using the
standard protocol during bolus administration of intravenous
contrast. Multiplanar CT image reconstructions and MIPs were
obtained to evaluate the vascular anatomy.
CONTRAST:  100mL OMNIPAQUE IOHEXOL 350 MG/ML SOLN

[Series 6: thins for pacs · axial · 0.63mm/px · z∈[+1252,+1460]mm · 18 of 231 slices shown]
[im 12/231  lung]
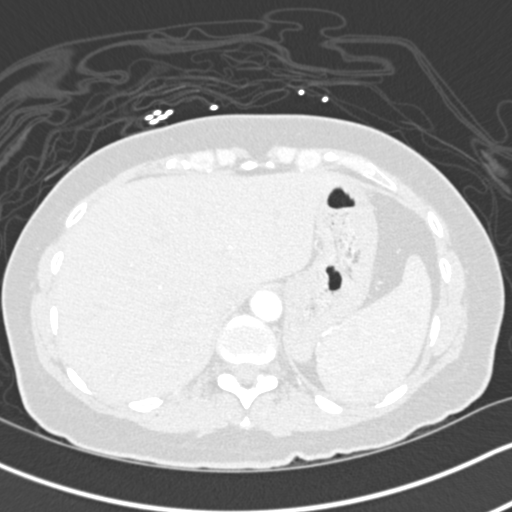
[im 24/231  mediastinal]
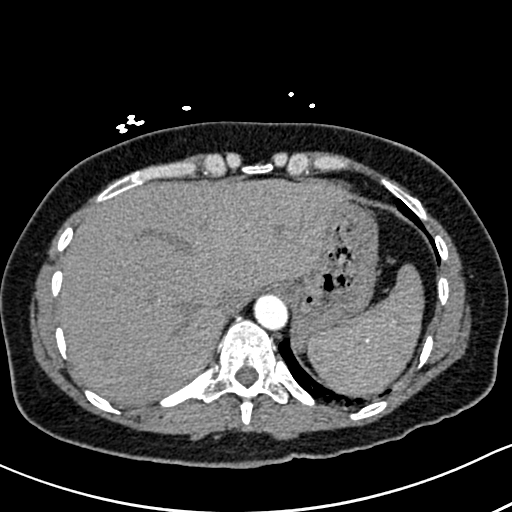
[im 35/231  lung]
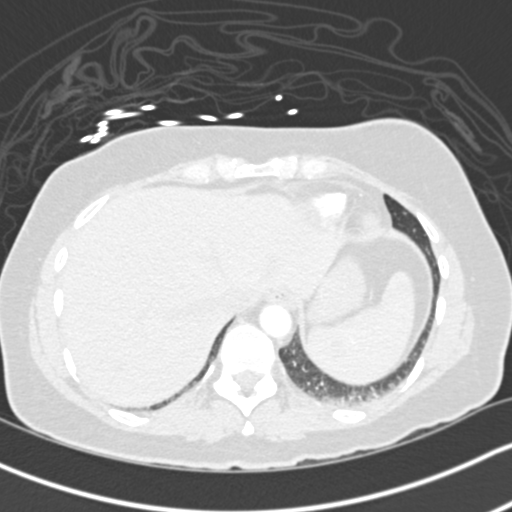
[im 47/231  mediastinal]
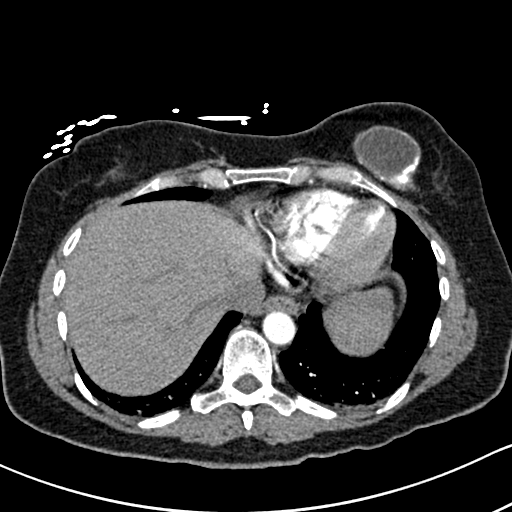
[im 58/231  lung]
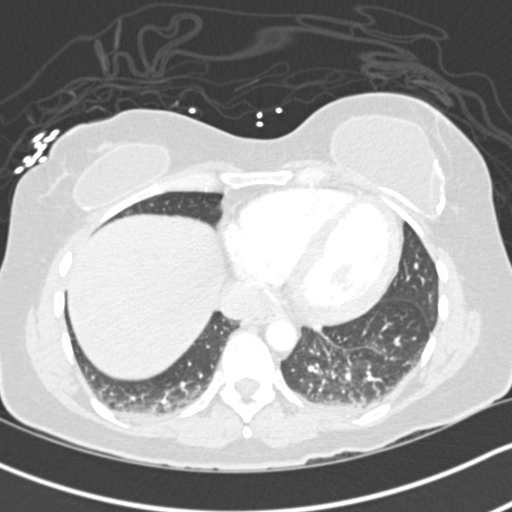
[im 70/231  mediastinal]
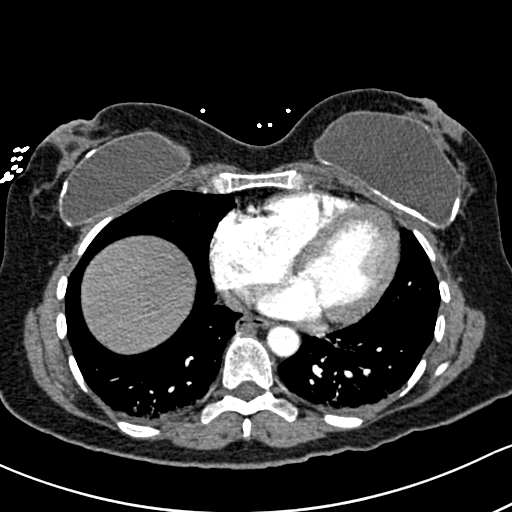
[im 81/231  lung]
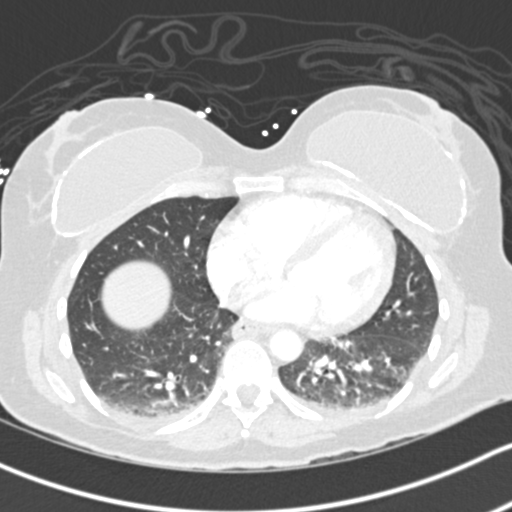
[im 93/231  mediastinal]
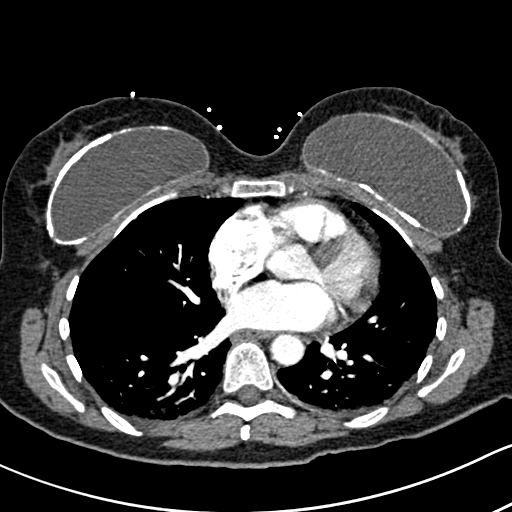
[im 104/231  lung]
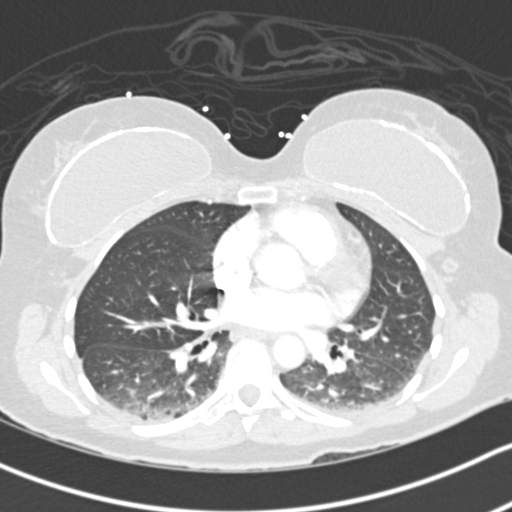
[im 127/231  mediastinal]
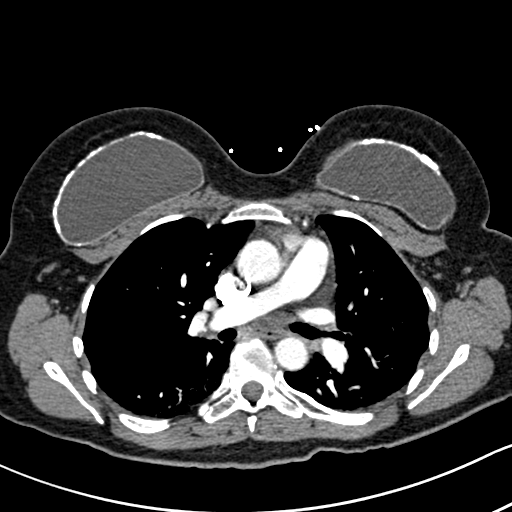
[im 139/231  lung]
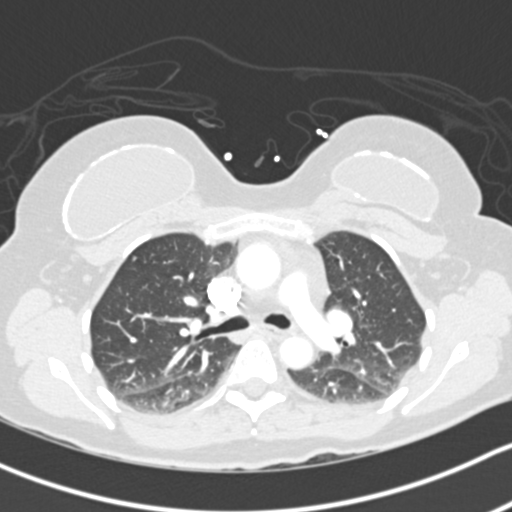
[im 150/231  mediastinal]
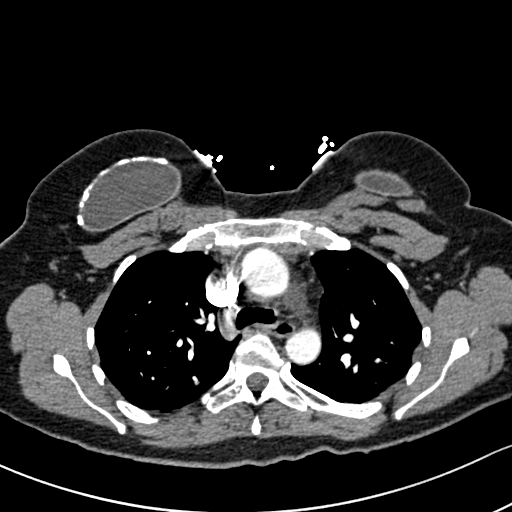
[im 162/231  lung]
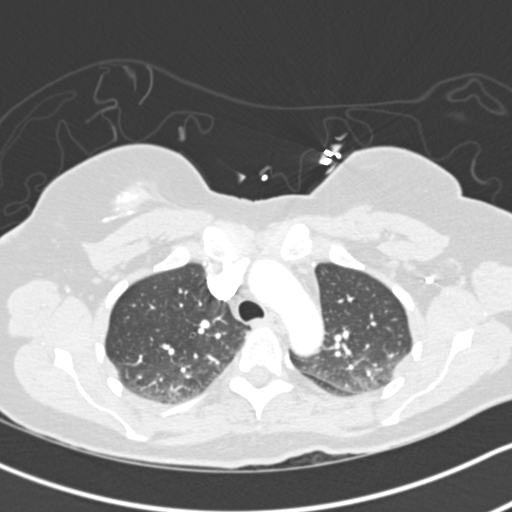
[im 173/231  mediastinal]
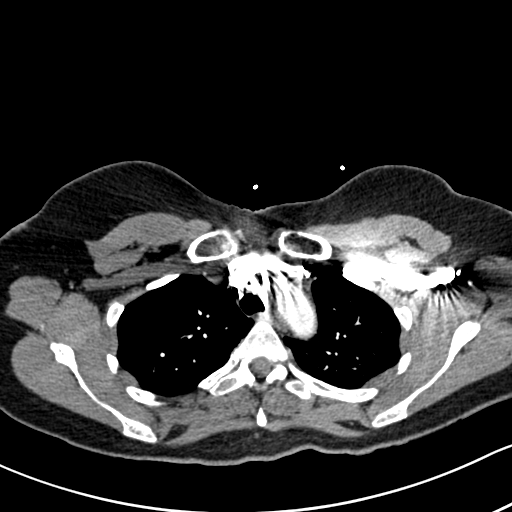
[im 185/231  lung]
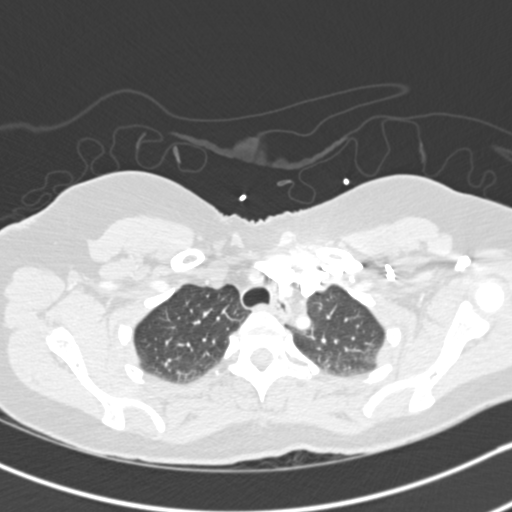
[im 196/231  mediastinal]
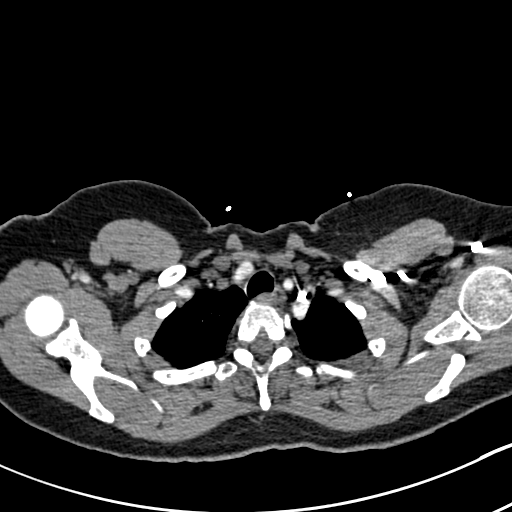
[im 208/231  lung]
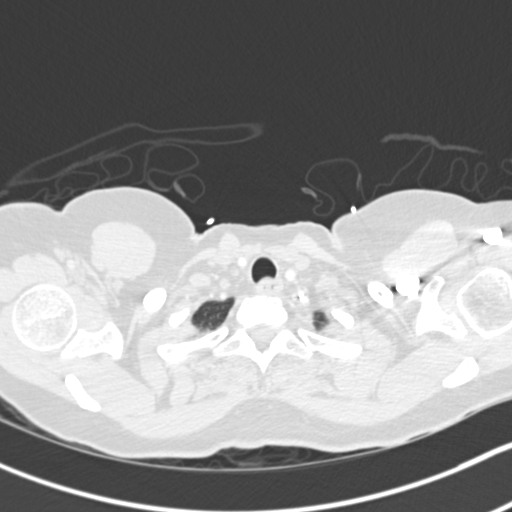
[im 219/231  mediastinal]
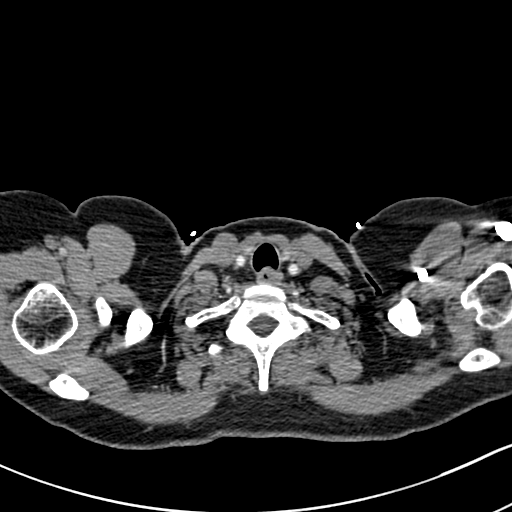

[Series 8: coronal mpr · coronal · 0.46mm/px · 1 of 102 slices shown]
[im 51/102  mediastinal]
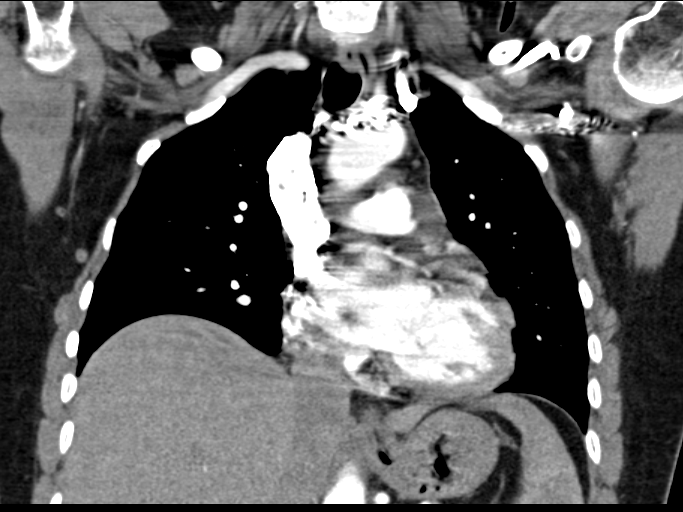

[19 of 36 positions shown; findings below may reference images not displayed]

FINDINGS: Angiographic study: No evidence of a pulmonary embolism. Great
vessels are normal in caliber. No atherosclerotic plaque along the
aorta. No dissection.

Neck base and axilla: No mass or adenopathy. Visualized thyroid is
unremarkable.

Mediastinum and hila: Heart normal in size. No coronary artery
calcifications. No mediastinal or hilar masses or enlarged lymph
nodes.

Lungs and pleura: Mild lower lobe dependent subsegmental
atelectasis. No evidence of pneumonia or pulmonary edema. No lung
mass or suspicious nodule. No pleural effusion or pneumothorax.

Limited upper abdomen:  Unremarkable.

Musculoskeletal: Minor spurring along the thoracic spine. Otherwise
unremarkable.

Review of the MIP images confirms the above findings.
IMPRESSION: 1. No evidence of a pulmonary embolism.
2. No evidence of pneumonia or pulmonary edema. Mild dependent
atelectasis in the lungs. No acute findings.

## 2016-07-23 DIAGNOSIS — Z01818 Encounter for other preprocedural examination: Secondary | ICD-10-CM | POA: Diagnosis not present

## 2016-07-23 DIAGNOSIS — F419 Anxiety disorder, unspecified: Secondary | ICD-10-CM | POA: Diagnosis not present

## 2016-07-23 DIAGNOSIS — Z1211 Encounter for screening for malignant neoplasm of colon: Secondary | ICD-10-CM | POA: Diagnosis not present

## 2016-09-10 DIAGNOSIS — Z1211 Encounter for screening for malignant neoplasm of colon: Secondary | ICD-10-CM | POA: Diagnosis not present

## 2016-09-10 DIAGNOSIS — K640 First degree hemorrhoids: Secondary | ICD-10-CM | POA: Diagnosis not present

## 2016-09-10 DIAGNOSIS — K5730 Diverticulosis of large intestine without perforation or abscess without bleeding: Secondary | ICD-10-CM | POA: Diagnosis not present

## 2016-09-14 ENCOUNTER — Ambulatory Visit (INDEPENDENT_AMBULATORY_CARE_PROVIDER_SITE_OTHER): Payer: BLUE CROSS/BLUE SHIELD | Admitting: Orthopaedic Surgery

## 2016-09-14 DIAGNOSIS — M25562 Pain in left knee: Secondary | ICD-10-CM

## 2016-09-14 DIAGNOSIS — M25561 Pain in right knee: Secondary | ICD-10-CM

## 2016-09-24 DIAGNOSIS — Z1231 Encounter for screening mammogram for malignant neoplasm of breast: Secondary | ICD-10-CM | POA: Diagnosis not present

## 2016-09-30 ENCOUNTER — Encounter: Payer: Self-pay | Admitting: Certified Nurse Midwife

## 2016-10-01 ENCOUNTER — Encounter: Payer: Self-pay | Admitting: Certified Nurse Midwife

## 2016-10-01 ENCOUNTER — Ambulatory Visit (INDEPENDENT_AMBULATORY_CARE_PROVIDER_SITE_OTHER): Payer: BLUE CROSS/BLUE SHIELD | Admitting: Certified Nurse Midwife

## 2016-10-01 VITALS — BP 122/82 | HR 74 | Resp 16 | Ht 65.25 in | Wt 161.0 lb

## 2016-10-01 DIAGNOSIS — Z01419 Encounter for gynecological examination (general) (routine) without abnormal findings: Secondary | ICD-10-CM

## 2016-10-01 DIAGNOSIS — N951 Menopausal and female climacteric states: Secondary | ICD-10-CM | POA: Diagnosis not present

## 2016-10-01 NOTE — Patient Instructions (Signed)

## 2016-10-01 NOTE — Progress Notes (Signed)
55 y.o. 642P2002 Married  Caucasian Fe here for annual exam. Menopausal no HRT. Denies vaginal bleeding, some vaginal dryness using KY with minimal change. Sees PCP yearly for medication management of anxiety, labs. Had left knee surgery, now back to exercising and being active. No other health issues today. [place] [facility] [place] [place]  No LMP recorded. Patient has had an ablation.          Sexually active: Yes.    The current method of family planning is post menopausal status.    Exercising: Yes.    walk, situps Smoker:  no  Health Maintenance: Pap:  09-16-15 neg MMG:  09-24-16 category b density birads 2:neg Colonoscopy: 9/ 2017 f/u 4760yrs neg BMD:   none TDaP:  2013 Shingles: no Pneumonia: no Hep C and HIV: not done Labs: pcp Self breast exam: done monthly   reports that she has never smoked. She has never used smokeless tobacco. She reports that she drinks about 3.0 - 3.6 oz of alcohol per week . She reports that she does not use drugs.  Past Medical History:  Diagnosis Date  . Anxiety   . Arthritis    oa  . Complication of anesthesia    does not like needles, has passsed out with needle insertion  . Dysplasia of cervix, low grade (CIN 1) 1987   cryo  . Hyperlipemia   . Loose body in joint    right knee    Past Surgical History:  Procedure Laterality Date  . AUGMENTATION MAMMAPLASTY     2009 ruptured silicone implants, replaced with saline 2009  . EYE SURGERY Bilateral 01-13-2005  . JOINT REPLACEMENT  12-13-2009   toe joint left big toe  . KNEE SURGERY Left 12-12-14   lateral release and meniscus repair  . LIPOSUCTION    . TOTAL KNEE ARTHROPLASTY Left 01/30/2016   Procedure: LEFT TOTAL KNEE ARTHROPLASTY;  Surgeon: Kathryne Hitchhristopher Y Blackman, MD;  Location: [facility];  Service: Orthopedics;  Laterality: Left;  . uterine ablation  07-14-2007    Current Outpatient Prescriptions  Medication Sig Dispense Refill  . Artificial Tear Ointment (DRY EYES OP) Apply 1 drop to eye every  morning. Reported on 03/03/2016    . B Complex-C (B-COMPLEX WITH VITAMIN C) tablet Take 1 tablet by mouth daily.    . Biotin w/ Vitamins C & E (HAIR/SKIN/NAILS PO) Take by mouth daily.    . Calcium Carbonate-Vitamin D (CALCIUM + D PO) Take 1 tablet by mouth every morning.     . cetirizine (ZYRTEC) 10 MG tablet Take 10 mg by mouth every morning.     . Cholecalciferol (VITAMIN D PO) Take 1,000 Int'l Units by mouth every morning.     Marland Kitchen. PARoxetine (PAXIL) 10 MG tablet Take 1 tablet (10 mg total) by mouth every morning. 30 tablet 11   No current facility-administered medications for this visit.     Family History  Problem Relation Age of Onset  . Hypertension Mother 7248  . Alzheimer's disease Mother   . Lymphoma Father   . Cancer Father     brain tumor    ROS:  Pertinent items are noted in HPI.  Otherwise, a comprehensive ROS was negative.  Exam:   BP 122/82   Pulse 74   Resp 16   Ht 5' 5.25" (1.657 m)   Wt 161 lb (73 kg)   BMI 26.59 kg/m  Height: 5' 5.25" (165.7 cm) Ht Readings from Last 3 Encounters:  10/01/16 5' 5.25" (1.657  m)  06/11/16 5' 2.25" (1.581 m)  02/12/16 5\' 6"  (1.676 m)    General appearance: alert, cooperative and appears stated age Head: Normocephalic, without obvious abnormality, atraumatic Neck: no adenopathy, supple, symmetrical, trachea midline and thyroid normal to inspection and palpation Lungs: clear to auscultation bilaterally Breasts: normal appearance, no masses or tenderness, No nipple retraction or dimpling, No nipple discharge or bleeding, No axillary or supraclavicular adenopathy Heart: regular rate and rhythm Abdomen: soft, non-tender; no masses,  no organomegaly Extremities: extremities normal, atraumatic, no cyanosis or edema Skin: Skin color, texture, turgor normal. No rashes or lesions Lymph nodes: Cervical, supraclavicular, and axillary nodes normal. No abnormal inguinal nodes palpated Neurologic: Grossly normal   Pelvic: External  genitalia:  no lesions              Urethra:  normal appearing urethra with no masses, tenderness or lesions              Bartholin's and Skene's: normal                 Vagina: normal appearing vagina with normal color and discharge, no lesions              Cervix: no cervical motion tenderness, no lesions and normal              Pap taken: No. Bimanual Exam:  Uterus:  normal size, contour, position, consistency, mobility, non-tender              Adnexa: normal adnexa and no mass, fullness, tenderness               Rectovaginal: Confirms               Anus:  normal sphincter tone, no lesions  Chaperone present: yes  A:  Well Woman with normal exam  Menopausal no HRT  Vaginal dryness  P:   Reviewed health and wellness pertinent to exam  Aware of need to evaluate if vaginal bleeding  Discussed OTC products for dryness and Astroglide use also. Has tried coconut oil but prefers OTC. Will advise if issues.  Pap smear as above not taken   counseled on breast self exam, mammography screening, menopause, adequate intake of calcium and vitamin D, diet and exercise  return annually or prn  An After Visit Summary was printed and given to the patient.

## 2016-10-08 NOTE — Progress Notes (Signed)
Encounter reviewed Jill Jertson, MD   

## 2017-01-20 ENCOUNTER — Ambulatory Visit (INDEPENDENT_AMBULATORY_CARE_PROVIDER_SITE_OTHER): Payer: BLUE CROSS/BLUE SHIELD | Admitting: Orthopaedic Surgery

## 2017-02-15 ENCOUNTER — Other Ambulatory Visit: Payer: Self-pay | Admitting: Certified Nurse Midwife

## 2017-02-15 NOTE — Telephone Encounter (Signed)
Patient requesting a refill [facility] [address] [phone].

## 2017-02-15 NOTE — Telephone Encounter (Signed)
Medication refill request: Paxil Last AEX:  10-01-16  Next AEX: 10-07-17  Last MMG (if hormonal medication request): 09-24-16 WNL  Refill authorized: please advise

## 2017-02-16 NOTE — Telephone Encounter (Signed)
Spoke with patient and scheduled her to come in and see Ms Eunice BlaseDebbie before any additional refills -eh

## 2017-02-16 NOTE — Telephone Encounter (Signed)
Spoke with patient and advised that we have not filled this medication since 07/2014 for 1 year. Patient insisted that we have been filling this for 15 years. She is going to call [facility] and find out who has been filling it over the past year. She will call back with an update.

## 2017-02-16 NOTE — Telephone Encounter (Signed)
Per review we have not refilled RX since 2015 with Dr. Edward JollySilva, patient may be obtaining from PCP per my last note she was.

## 2017-02-16 NOTE — Telephone Encounter (Signed)
Patient returned called back and reported [facility] has been prescribing her Paxil the last two years but now wants her to come back in for a recheck she "doesn't need."  Patient said Patricia Sauerseborah [name], CNM prescribed it for her for many years prior to her provider [facility], she'd like to her manage this prescription for her.

## 2017-02-18 ENCOUNTER — Ambulatory Visit: Payer: Self-pay | Admitting: Certified Nurse Midwife

## 2017-05-16 DIAGNOSIS — E559 Vitamin D deficiency, unspecified: Secondary | ICD-10-CM | POA: Diagnosis not present

## 2017-05-16 DIAGNOSIS — Z1159 Encounter for screening for other viral diseases: Secondary | ICD-10-CM | POA: Diagnosis not present

## 2017-05-16 DIAGNOSIS — E7800 Pure hypercholesterolemia, unspecified: Secondary | ICD-10-CM | POA: Diagnosis not present

## 2017-05-16 DIAGNOSIS — Z0000 Encounter for general adult medical examination without abnormal findings: Secondary | ICD-10-CM | POA: Diagnosis not present

## 2017-05-19 DIAGNOSIS — Z0000 Encounter for general adult medical examination without abnormal findings: Secondary | ICD-10-CM | POA: Diagnosis not present

## 2017-06-27 DIAGNOSIS — R404 Transient alteration of awareness: Secondary | ICD-10-CM | POA: Diagnosis not present

## 2017-06-27 DIAGNOSIS — H9202 Otalgia, left ear: Secondary | ICD-10-CM | POA: Diagnosis not present

## 2017-06-27 DIAGNOSIS — R55 Syncope and collapse: Secondary | ICD-10-CM | POA: Diagnosis not present

## 2017-09-30 DIAGNOSIS — Z1231 Encounter for screening mammogram for malignant neoplasm of breast: Secondary | ICD-10-CM | POA: Diagnosis not present

## 2017-10-07 ENCOUNTER — Ambulatory Visit (INDEPENDENT_AMBULATORY_CARE_PROVIDER_SITE_OTHER): Payer: BLUE CROSS/BLUE SHIELD | Admitting: Certified Nurse Midwife

## 2017-10-07 ENCOUNTER — Encounter: Payer: Self-pay | Admitting: Certified Nurse Midwife

## 2017-10-07 VITALS — BP 120/82 | HR 80 | Resp 16 | Ht 65.75 in | Wt 163.0 lb

## 2017-10-07 DIAGNOSIS — N898 Other specified noninflammatory disorders of vagina: Secondary | ICD-10-CM | POA: Diagnosis not present

## 2017-10-07 DIAGNOSIS — Z01419 Encounter for gynecological examination (general) (routine) without abnormal findings: Secondary | ICD-10-CM | POA: Diagnosis not present

## 2017-10-07 DIAGNOSIS — Z8659 Personal history of other mental and behavioral disorders: Secondary | ICD-10-CM

## 2017-10-07 DIAGNOSIS — N951 Menopausal and female climacteric states: Secondary | ICD-10-CM | POA: Diagnosis not present

## 2017-10-07 NOTE — Patient Instructions (Signed)

## 2017-10-07 NOTE — Progress Notes (Signed)
56 y.o. [id number] Married  Caucasian Fe here for annual exam. Menopausal no symptoms now. No vaginal bleeding. Treating vaginal dryness with coconut oil with good results. Sees PCP for aex/labs and managment of elevated cholesterol. All normal other wise. No health issues today. Staying busy with work and enjoys it. Both children graduated from [employer] and living in [place]!      No LMP recorded. Patient has had an ablation.          Sexually active: Yes.    The current method of family planning is ablation.    Exercising: Yes.    weights, eliptical, yoga, walking Smoker:  no  Health Maintenance: Pap:  09-16-15 neg  History of Abnormal Pap: yes with cryo in 1987 MMG:  09-24-16 category b density birads 2:neg, 09-30-17 neg per patient Self Breast exams: yes Colonoscopy:  9/17 f/u 5332yrs BMD:   none TDaP:  2013 Shingles: no Pneumonia: had done once Hep C and HIV: hep c neg 6/18 per patient Labs: none needed   reports that she has never smoked. She has never used smokeless tobacco. She reports that she drinks about 3.0 - 3.6 oz of alcohol per week . She reports that she does not use drugs.  Past Medical History:  Diagnosis Date  . Anxiety   . Arthritis    oa  . Complication of anesthesia    does not like needles, has passsed out with needle insertion  . Dysplasia of cervix, low grade (CIN 1) 1987   cryo  . Hyperlipemia   . Loose body in joint    right knee    Past Surgical History:  Procedure Laterality Date  . AUGMENTATION MAMMAPLASTY     2009 ruptured silicone implants, replaced with saline 2009  . EYE SURGERY Bilateral 01-13-2005  . JOINT REPLACEMENT  12-13-2009   toe joint left big toe  . KNEE SURGERY Left 12-12-14   lateral release and meniscus repair  . LIPOSUCTION    . TOTAL KNEE ARTHROPLASTY Left 01/30/2016   Procedure: LEFT TOTAL KNEE ARTHROPLASTY;  Surgeon: Kathryne Hitchhristopher Y Blackman, MD;  Location: [facility];  Service: Orthopedics;  Laterality: Left;  . uterine ablation   07-14-2007    Current Outpatient Prescriptions  Medication Sig Dispense Refill  . Artificial Tear Ointment (DRY EYES OP) Apply 1 drop to eye every morning. Reported on 03/03/2016    . B Complex-C (B-COMPLEX WITH VITAMIN C) tablet Take 1 tablet by mouth daily.    . Biotin w/ Vitamins C & E (HAIR/SKIN/NAILS PO) Take by mouth daily.    . Calcium Carbonate-Vitamin D (CALCIUM + D PO) Take 1 tablet by mouth every morning.     . cetirizine (ZYRTEC) 10 MG tablet Take 10 mg by mouth every morning.     . Cholecalciferol (VITAMIN D PO) Take 1,000 Int'l Units by mouth every morning.     Marland Kitchen. PARoxetine (PAXIL) 10 MG tablet Take 1 tablet (10 mg total) by mouth every morning. 30 tablet 11   No current facility-administered medications for this visit.     Family History  Problem Relation Age of Onset  . Hypertension Mother 5548  . Alzheimer's disease Mother   . Lymphoma Father   . Cancer Father        brain tumor    ROS:  Pertinent items are noted in HPI.  Otherwise, a comprehensive ROS was negative.  Exam:   There were no vitals taken for this visit.   Ht Readings from Last  3 Encounters:  10/01/16 5' 5.25" (1.657 m)  06/11/16 5' 2.25" (1.581 m)  02/12/16 5\' 6"  (1.676 m)    General appearance: alert, cooperative and appears stated age Head: Normocephalic, without obvious abnormality, atraumatic Neck: no adenopathy, supple, symmetrical, trachea midline and thyroid normal to inspection and palpation Lungs: clear to auscultation bilaterally Breasts: normal appearance, no masses or tenderness, No nipple retraction or dimpling, No nipple discharge or bleeding, No axillary or supraclavicular adenopathy Saline implants feel intact. Heart: regular rate and rhythm Abdomen: soft, non-tender; no masses,  no organomegaly Extremities: extremities normal, atraumatic, no cyanosis or edema Skin: Skin color, texture, turgor normal. No rashes or lesions Lymph nodes: Cervical, supraclavicular, and axillary nodes  normal. No abnormal inguinal nodes palpated Neurologic: Grossly normal   Pelvic: External genitalia:  no lesions              Urethra:  normal appearing urethra with no masses, tenderness or lesions              Bartholin's and Skene's: normal                 Vagina: normal appearing vagina with normal color and discharge, no lesions              Cervix: multiparous appearance, no cervical motion tenderness and no lesions              Pap taken: No. Bimanual Exam:  Uterus:  normal size, contour, position, consistency, mobility, non-tender              Adnexa: normal adnexa and no mass, fullness, tenderness               Rectovaginal: Confirms               Anus:  normal sphincter tone, no lesions  Chaperone present: yes  A:  Well Woman with normal exam  Menopausal no HRT  Vaginal dryness with coconut oil use with good results  PCP management of cholesterol/anxiety  P:   Reviewed health and wellness pertinent to exam  Aware of need to advise if vaginal bleeding.  Continue coconut oil for dryness, discussed at bedtime use for better absorption, with instructions given.  Continue follow up with PCP as indicated.  Pap smear: no   counseled on breast self exam, mammography screening, menopause, adequate intake of calcium and vitamin D, diet and exercise  return annually or prn  An After Visit Summary was printed and given to the patient.

## 2017-12-27 DIAGNOSIS — L309 Dermatitis, unspecified: Secondary | ICD-10-CM | POA: Diagnosis not present

## 2018-05-01 DIAGNOSIS — J309 Allergic rhinitis, unspecified: Secondary | ICD-10-CM | POA: Diagnosis not present

## 2018-05-22 DIAGNOSIS — E7800 Pure hypercholesterolemia, unspecified: Secondary | ICD-10-CM | POA: Diagnosis not present

## 2018-05-22 DIAGNOSIS — E559 Vitamin D deficiency, unspecified: Secondary | ICD-10-CM | POA: Diagnosis not present

## 2018-05-25 DIAGNOSIS — E7800 Pure hypercholesterolemia, unspecified: Secondary | ICD-10-CM | POA: Diagnosis not present

## 2018-05-25 DIAGNOSIS — J309 Allergic rhinitis, unspecified: Secondary | ICD-10-CM | POA: Diagnosis not present

## 2018-05-25 DIAGNOSIS — F419 Anxiety disorder, unspecified: Secondary | ICD-10-CM | POA: Diagnosis not present

## 2018-05-25 DIAGNOSIS — E559 Vitamin D deficiency, unspecified: Secondary | ICD-10-CM | POA: Diagnosis not present

## 2018-05-25 DIAGNOSIS — Z0000 Encounter for general adult medical examination without abnormal findings: Secondary | ICD-10-CM | POA: Diagnosis not present

## 2018-07-17 ENCOUNTER — Telehealth (INDEPENDENT_AMBULATORY_CARE_PROVIDER_SITE_OTHER): Payer: Self-pay | Admitting: Orthopaedic Surgery

## 2018-07-17 NOTE — Telephone Encounter (Signed)
Patient called advised she has a dentist appointment on Wednesday and will need a note stating she will not need an antibiotic. Patient asked lf note can be faxed to her. The fax # is [phone]  The number to contact patient is [phone]

## 2018-07-17 NOTE — Telephone Encounter (Signed)
Faxed to provided number  

## 2018-10-13 ENCOUNTER — Other Ambulatory Visit: Payer: Self-pay

## 2018-10-13 ENCOUNTER — Other Ambulatory Visit (HOSPITAL_COMMUNITY)
Admission: RE | Admit: 2018-10-13 | Discharge: 2018-10-13 | Disposition: A | Discharge status: Home / Self Care | Payer: BLUE CROSS/BLUE SHIELD | Source: Ambulatory Visit | Attending: Obstetrics & Gynecology | Admitting: Obstetrics & Gynecology

## 2018-10-13 ENCOUNTER — Encounter: Payer: Self-pay | Admitting: Certified Nurse Midwife

## 2018-10-13 ENCOUNTER — Ambulatory Visit (INDEPENDENT_AMBULATORY_CARE_PROVIDER_SITE_OTHER): Payer: BLUE CROSS/BLUE SHIELD | Admitting: Certified Nurse Midwife

## 2018-10-13 VITALS — BP 110/70 | HR 70 | Resp 16 | Ht 65.25 in | Wt 162.0 lb

## 2018-10-13 DIAGNOSIS — Z01419 Encounter for gynecological examination (general) (routine) without abnormal findings: Secondary | ICD-10-CM

## 2018-10-13 DIAGNOSIS — Z124 Encounter for screening for malignant neoplasm of cervix: Secondary | ICD-10-CM

## 2018-10-13 DIAGNOSIS — Z1231 Encounter for screening mammogram for malignant neoplasm of breast: Secondary | ICD-10-CM | POA: Diagnosis not present

## 2018-10-13 NOTE — Progress Notes (Addendum)
57 y.o. G25P2002 Married  Caucasian Fe here for annual exam. Menopausal no HRT, hot flashes or night sweats.  Denies vaginal bleeding. Using Replens for dryness with good results. Sees PCP for aex, labs and anxiety medication. All stable per patient. Enjoying visiting sons in [place]. No health issues today.  No LMP recorded. Patient has had an ablation.          Sexually active: Yes.    The current method of family planning is post menopausal status.    Exercising: Yes.    elliptical Smoker:  no  Review of Systems  Constitutional: Negative.   HENT: Negative.   Eyes: Negative.   Respiratory: Negative.   Cardiovascular: Negative.   Gastrointestinal: Negative.   Genitourinary: Negative.   Musculoskeletal: Negative.   Skin: Negative.   Neurological: Negative.   Endo/Heme/Allergies: Negative.   Psychiatric/Behavioral: Negative.     Health Maintenance: Pap:  09-16-15 neg History of Abnormal Pap: yes MMG:  Today, waiting for fax Self Breast exams: yes Colonoscopy:  9/17 f/u 36yrs BMD:   none TDaP:  2013 Shingles: no Pneumonia: had done Hep C and HIV: hep c neg 2018 per patient Labs: yes   reports that she has never smoked. She has never used smokeless tobacco. She reports that she drinks about 4.0 - 6.0 standard drinks of alcohol per week. She reports that she does not use drugs.  Past Medical History:  Diagnosis Date  . Anxiety   . Arthritis    oa  . Complication of anesthesia    does not like needles, has passsed out with needle insertion  . Dysplasia of cervix, low grade (CIN 1) 1987   cryo  . Hyperlipemia   . Loose body in joint    right knee    Past Surgical History:  Procedure Laterality Date  . AUGMENTATION MAMMAPLASTY     2009 ruptured silicone implants, replaced with saline 2009  . EYE SURGERY Bilateral 01-13-2005  . JOINT REPLACEMENT  12-13-2009   toe joint left big toe  . KNEE SURGERY Left 12-12-14   lateral release and meniscus repair  . LIPOSUCTION    .  TOTAL KNEE ARTHROPLASTY Left 01/30/2016   Procedure: LEFT TOTAL KNEE ARTHROPLASTY;  Surgeon: Kathryne Hitch, MD;  Location: [facility];  Service: Orthopedics;  Laterality: Left;  . uterine ablation  07-14-2007    Current Outpatient Medications  Medication Sig Dispense Refill  . Artificial Tear Ointment (DRY EYES OP) Apply 1 drop to eye every morning. Reported on 03/03/2016    . B Complex-C (B-COMPLEX WITH VITAMIN C) tablet Take 1 tablet by mouth daily.    . Calcium Carbonate-Vitamin D (CALCIUM + D PO) Take 1 tablet by mouth every morning.     . cetirizine (ZYRTEC) 10 MG tablet Take 10 mg by mouth every morning.     . Cholecalciferol (VITAMIN D PO) Take 1,000 Int'l Units by mouth every morning.     . montelukast (SINGULAIR) 10 MG tablet TAKE 1 TABLET BY MOUTH ONCE DAILY FOR 90 DAYS  3  . Multiple Vitamins-Minerals (MULTIVITAMIN PO) Take by mouth.    Marland Kitchen PARoxetine (PAXIL) 10 MG tablet Take 1 tablet (10 mg total) by mouth every morning. 30 tablet 11   No current facility-administered medications for this visit.     Family History  Problem Relation Age of Onset  . Hypertension Mother 69  . Alzheimer's disease Mother   . Lymphoma Father   . Cancer Father  brain tumor    ROS:  Pertinent items are noted in HPI.  Otherwise, a comprehensive ROS was negative.  Exam:   BP 110/70   Pulse 70   Resp 16   Ht 5' 5.25" (1.657 m)   Wt 162 lb (73.5 kg)   BMI 26.75 kg/m  Height: 5' 5.25" (165.7 cm) Ht Readings from Last 3 Encounters:  10/13/18 5' 5.25" (1.657 m)  10/07/17 5' 5.75" (1.67 m)  10/01/16 5' 5.25" (1.657 m)    General appearance: alert, cooperative and appears stated age Head: Normocephalic, without obvious abnormality, atraumatic Neck: no adenopathy, supple, symmetrical, trachea midline and thyroid normal to inspection and palpation Lungs: clear to auscultation bilaterally Breasts: normal appearance, no masses or tenderness, No nipple retraction or dimpling, No nipple  discharge or bleeding, No axillary or supraclavicular adenopathy Heart: regular rate and rhythm Abdomen: soft, non-tender; no masses,  no organomegaly Extremities: extremities normal, atraumatic, no cyanosis or edema Skin: Skin color, texture, turgor normal. No rashes or lesions Lymph nodes: Cervical, supraclavicular, and axillary nodes normal. No abnormal inguinal nodes palpated Neurologic: Grossly normal   Pelvic: External genitalia:  no lesions, atrophic appearance              Urethra:  normal appearing urethra with no masses, tenderness or lesions              Bartholin's and Skene's: normal                 Vagina: normal appearing vagina with normal color and discharge, no lesions              Cervix: multiparous appearance, no cervical motion tenderness and no lesions              Pap taken: Yes.   Bimanual Exam:  Uterus:  normal size, contour, position, consistency, mobility, non-tender              Adnexa: normal adnexa and no mass, fullness, tenderness               Rectovaginal: Confirms               Anus:  normal sphincter tone, no lesions  Chaperone present: yes  A:  Well Woman with normal exam  Menopausal no HRT.  Vaginal dryness using Replens with good response  Anxiety with PCP management  Mammogram today with results pending  P:   Reviewed health and wellness pertinent to exam  Aware of need to advise if vaginal bleeding  Continue follow up with PCP as indicated  Pap smear: yes    counseled on breast self exam, mammography screening, adequate intake of calcium and vitamin D, diet and exercise  return annually or prn  An After Visit Summary was printed and given to the patient.

## 2018-10-13 NOTE — Patient Instructions (Signed)

## 2018-10-17 LAB — CYTOLOGY - PAP
Diagnosis: NEGATIVE
HPV: NOT DETECTED

## 2019-05-29 DIAGNOSIS — E559 Vitamin D deficiency, unspecified: Secondary | ICD-10-CM | POA: Diagnosis not present

## 2019-05-29 DIAGNOSIS — E7800 Pure hypercholesterolemia, unspecified: Secondary | ICD-10-CM | POA: Diagnosis not present

## 2019-06-01 DIAGNOSIS — F419 Anxiety disorder, unspecified: Secondary | ICD-10-CM | POA: Diagnosis not present

## 2019-06-01 DIAGNOSIS — E559 Vitamin D deficiency, unspecified: Secondary | ICD-10-CM | POA: Diagnosis not present

## 2019-06-01 DIAGNOSIS — E7800 Pure hypercholesterolemia, unspecified: Secondary | ICD-10-CM | POA: Diagnosis not present

## 2019-06-01 DIAGNOSIS — Z0000 Encounter for general adult medical examination without abnormal findings: Secondary | ICD-10-CM | POA: Diagnosis not present

## 2019-06-01 DIAGNOSIS — J309 Allergic rhinitis, unspecified: Secondary | ICD-10-CM | POA: Diagnosis not present

## 2019-07-09 DIAGNOSIS — R5383 Other fatigue: Secondary | ICD-10-CM | POA: Diagnosis not present

## 2019-07-09 DIAGNOSIS — Z20828 Contact with and (suspected) exposure to other viral communicable diseases: Secondary | ICD-10-CM | POA: Diagnosis not present

## 2019-07-10 DIAGNOSIS — Z20828 Contact with and (suspected) exposure to other viral communicable diseases: Secondary | ICD-10-CM | POA: Diagnosis not present

## 2019-07-17 DIAGNOSIS — H6992 Unspecified Eustachian tube disorder, left ear: Secondary | ICD-10-CM | POA: Insufficient documentation

## 2019-07-17 DIAGNOSIS — H9193 Unspecified hearing loss, bilateral: Secondary | ICD-10-CM | POA: Diagnosis not present

## 2019-07-17 DIAGNOSIS — M26623 Arthralgia of bilateral temporomandibular joint: Secondary | ICD-10-CM | POA: Diagnosis not present

## 2019-07-17 DIAGNOSIS — H6982 Other specified disorders of Eustachian tube, left ear: Secondary | ICD-10-CM | POA: Insufficient documentation

## 2019-07-17 DIAGNOSIS — H918X2 Other specified hearing loss, left ear: Secondary | ICD-10-CM | POA: Diagnosis not present

## 2019-07-18 DIAGNOSIS — H918X2 Other specified hearing loss, left ear: Secondary | ICD-10-CM | POA: Insufficient documentation

## 2019-07-18 DIAGNOSIS — IMO0001 Reserved for inherently not codable concepts without codable children: Secondary | ICD-10-CM | POA: Insufficient documentation

## 2019-07-18 DIAGNOSIS — M26623 Arthralgia of bilateral temporomandibular joint: Secondary | ICD-10-CM | POA: Insufficient documentation

## 2019-08-31 DIAGNOSIS — Z79899 Other long term (current) drug therapy: Secondary | ICD-10-CM | POA: Diagnosis not present

## 2019-08-31 DIAGNOSIS — E7800 Pure hypercholesterolemia, unspecified: Secondary | ICD-10-CM | POA: Diagnosis not present

## 2019-10-19 ENCOUNTER — Ambulatory Visit: Payer: BLUE CROSS/BLUE SHIELD | Admitting: Certified Nurse Midwife

## 2019-10-26 DIAGNOSIS — Z1231 Encounter for screening mammogram for malignant neoplasm of breast: Secondary | ICD-10-CM | POA: Diagnosis not present

## 2019-10-31 ENCOUNTER — Other Ambulatory Visit: Payer: Self-pay

## 2019-11-02 ENCOUNTER — Other Ambulatory Visit: Payer: Self-pay

## 2019-11-02 ENCOUNTER — Ambulatory Visit (INDEPENDENT_AMBULATORY_CARE_PROVIDER_SITE_OTHER): Payer: BC Managed Care – PPO | Admitting: Certified Nurse Midwife

## 2019-11-02 ENCOUNTER — Encounter: Payer: Self-pay | Admitting: Certified Nurse Midwife

## 2019-11-02 VITALS — BP 120/80 | HR 68 | Temp 97.1°F | Resp 16 | Ht 65.5 in | Wt 171.0 lb

## 2019-11-02 DIAGNOSIS — Z01419 Encounter for gynecological examination (general) (routine) without abnormal findings: Secondary | ICD-10-CM | POA: Diagnosis not present

## 2019-11-02 NOTE — Progress Notes (Signed)
57 y.o. G63P2002 Married  Caucasian Fe here for annual exam. Menopausal no HRT. Denies vaginal bleeding and vaginal dryness same, using Replens and coconut oil. No Urinary issues.Luberta Robertson PCP for aex, labs and cholesterol,anxiety medication all stable. Working home with no issues. No other health issues today.  No LMP recorded. Patient has had an ablation.          Sexually active: Yes.    The current method of family planning is post menopausal status.    Exercising: Yes.    eliptical Smoker:  no  Review of Systems  Constitutional: Negative.   HENT: Negative.   Eyes: Negative.   Respiratory: Negative.   Cardiovascular: Negative.   Gastrointestinal: Negative.   Genitourinary: Negative.   Musculoskeletal: Negative.   Skin: Negative.   Neurological: Negative.   Endo/Heme/Allergies: Negative.   Psychiatric/Behavioral: Negative.     Health Maintenance: Pap:  09-16-15 neg, 10-13-18 neg HPV HR neg History of Abnormal Pap: yes MMG:  Done last week-neg per patient, Self Breast exams: yes Colonoscopy:  9/17 f/u 45yrs BMD:   none TDaP:  2013 Shingles: not done Pneumonia: had done Hep C and HIV: hep c neg 2018 per patient Labs: PCP   reports that she has never smoked. She has never used smokeless tobacco. She reports current alcohol use of about 4.0 - 6.0 standard drinks of alcohol per week. She reports that she does not use drugs.  Past Medical History:  Diagnosis Date  . Anxiety   . Arthritis    oa  . Complication of anesthesia    does not like needles, has passsed out with needle insertion  . Dysplasia of cervix, low grade (CIN 1) 1987   cryo  . Hyperlipemia   . Loose body in joint    right knee    Past Surgical History:  Procedure Laterality Date  . AUGMENTATION MAMMAPLASTY     2009 ruptured silicone implants, replaced with saline 2009  . EYE SURGERY Bilateral 01-13-2005  . JOINT REPLACEMENT  12-13-2009   toe joint left big toe  . KNEE SURGERY Left 12-12-14   lateral  release and meniscus repair  . LIPOSUCTION    . TOTAL KNEE ARTHROPLASTY Left 01/30/2016   Procedure: LEFT TOTAL KNEE ARTHROPLASTY;  Surgeon: Kathryne Hitch, MD;  Location: [facility];  Service: Orthopedics;  Laterality: Left;  . uterine ablation  07-14-2007    Current Outpatient Medications  Medication Sig Dispense Refill  . Artificial Tear Ointment (DRY EYES OP) Apply 1 drop to eye every morning. Reported on 03/03/2016    . Calcium Carbonate-Vitamin D (CALCIUM + D PO) Take 1 tablet by mouth every morning.     . cetirizine (ZYRTEC) 10 MG tablet Take 10 mg by mouth every morning.     . Cholecalciferol (VITAMIN D PO) Take 1,000 Int'l Units by mouth every morning.     . montelukast (SINGULAIR) 10 MG tablet TAKE 1 TABLET BY MOUTH ONCE DAILY FOR 90 DAYS  3  . Multiple Vitamins-Minerals (MULTIVITAMIN PO) Take by mouth.    Marland Kitchen PARoxetine (PAXIL) 20 MG tablet Take by mouth.    . rosuvastatin (CRESTOR) 5 MG tablet Take 5 mg by mouth daily.     No current facility-administered medications for this visit.     Family History  Problem Relation Age of Onset  . Hypertension Mother 23  . Alzheimer's disease Mother   . Lymphoma Father   . Cancer Father        brain tumor  ROS:  Pertinent items are noted in HPI.  Otherwise, a comprehensive ROS was negative.  Exam:   BP 120/80   Pulse 68   Temp (!) 97.1 F (36.2 C) (Skin)   Resp 16   Ht 5' 5.5" (1.664 m)   Wt 171 lb (77.6 kg)   BMI 28.02 kg/m  Height: 5' 5.5" (166.4 cm) Ht Readings from Last 3 Encounters:  11/02/19 5' 5.5" (1.664 m)  10/13/18 5' 5.25" (1.657 m)  10/07/17 5' 5.75" (1.67 m)    General appearance: alert, cooperative and appears stated age Head: Normocephalic, without obvious abnormality, atraumatic Neck: no adenopathy, supple, symmetrical, trachea midline and thyroid normal to inspection and palpation Lungs: clear to auscultation bilaterally Breasts: normal appearance, no masses or tenderness, No nipple retraction or  dimpling, No nipple discharge or bleeding, No axillary or supraclavicular adenopathy Heart: regular rate and rhythm Abdomen: soft, non-tender; no masses,  no organomegaly Extremities: extremities normal, atraumatic, no cyanosis or edema Skin: Skin color, texture, turgor normal. No rashes or lesions Lymph nodes: Cervical, supraclavicular, and axillary nodes normal. No abnormal inguinal nodes palpated Neurologic: Grossly normal   Pelvic: External genitalia:  no lesions              Urethra:  normal appearing urethra with no masses, tenderness or lesions              Bartholin's and Skene's: normal                 Vagina: normal appearing vagina with normal color and discharge, no lesions              Cervix: no cervical motion tenderness, no lesions and normal appearance              Pap taken: No. Bimanual Exam:  Uterus:  normal size, contour, position, consistency, mobility, non-tender and anteverted              Adnexa: normal adnexa and no mass, fullness, tenderness               Rectovaginal: Confirms               Anus:  normal sphincter tone, no lesions  Chaperone present: yes  A:  Well Woman with normal exam  Post menopausal no HRT  Vaginal dryness Replens and coconut oil working well  Cholesterol, anxiety, labs with PCP. Cholesterol still elevated, but working on diet and taking medication.  Recent mammogram normal per patient (results not in)  P:   Reviewed health and wellness pertinent to exam  Aware of need to advise if vaginal bleeding  Continue with moisture in vaginal, which also helps prevent UTI occurrence.  Continue follow up with PCP as indicated.  Pap smear: no   counseled on breast self exam, mammography screening, feminine hygiene, menopause, adequate intake of calcium and vitamin D, diet and exercise, Kegel's exercises  return annually or prn  An After Visit Summary was printed and given to the patient.

## 2019-11-02 NOTE — Patient Instructions (Signed)

## 2019-12-19 ENCOUNTER — Other Ambulatory Visit: Payer: BC Managed Care – PPO

## 2019-12-19 ENCOUNTER — Ambulatory Visit: Payer: BC Managed Care – PPO | Attending: Internal Medicine

## 2019-12-19 DIAGNOSIS — Z20822 Contact with and (suspected) exposure to covid-19: Secondary | ICD-10-CM

## 2019-12-21 LAB — NOVEL CORONAVIRUS, NAA: SARS-CoV-2, NAA: NOT DETECTED

## 2019-12-27 ENCOUNTER — Ambulatory Visit: Payer: BC Managed Care – PPO | Attending: Internal Medicine

## 2019-12-27 DIAGNOSIS — Z20822 Contact with and (suspected) exposure to covid-19: Secondary | ICD-10-CM | POA: Diagnosis not present

## 2019-12-28 LAB — NOVEL CORONAVIRUS, NAA: SARS-CoV-2, NAA: NOT DETECTED

## 2020-03-03 ENCOUNTER — Encounter: Payer: Self-pay | Admitting: Certified Nurse Midwife

## 2020-03-08 DIAGNOSIS — Z23 Encounter for immunization: Secondary | ICD-10-CM | POA: Diagnosis not present

## 2020-04-05 DIAGNOSIS — Z23 Encounter for immunization: Secondary | ICD-10-CM | POA: Diagnosis not present

## 2020-05-30 DIAGNOSIS — E559 Vitamin D deficiency, unspecified: Secondary | ICD-10-CM | POA: Diagnosis not present

## 2020-05-30 DIAGNOSIS — E7800 Pure hypercholesterolemia, unspecified: Secondary | ICD-10-CM | POA: Diagnosis not present

## 2020-06-05 DIAGNOSIS — F419 Anxiety disorder, unspecified: Secondary | ICD-10-CM | POA: Diagnosis not present

## 2020-06-05 DIAGNOSIS — Z0000 Encounter for general adult medical examination without abnormal findings: Secondary | ICD-10-CM | POA: Diagnosis not present

## 2020-06-05 DIAGNOSIS — J309 Allergic rhinitis, unspecified: Secondary | ICD-10-CM | POA: Diagnosis not present

## 2020-06-05 DIAGNOSIS — E7800 Pure hypercholesterolemia, unspecified: Secondary | ICD-10-CM | POA: Diagnosis not present

## 2020-06-05 DIAGNOSIS — E559 Vitamin D deficiency, unspecified: Secondary | ICD-10-CM | POA: Diagnosis not present

## 2020-10-31 DIAGNOSIS — Z1231 Encounter for screening mammogram for malignant neoplasm of breast: Secondary | ICD-10-CM | POA: Diagnosis not present

## 2020-12-19 ENCOUNTER — Ambulatory Visit: Payer: BC Managed Care – PPO | Admitting: Certified Nurse Midwife

## 2021-06-08 DIAGNOSIS — E559 Vitamin D deficiency, unspecified: Secondary | ICD-10-CM | POA: Diagnosis not present

## 2021-06-08 DIAGNOSIS — E7800 Pure hypercholesterolemia, unspecified: Secondary | ICD-10-CM | POA: Diagnosis not present

## 2021-06-12 DIAGNOSIS — E7800 Pure hypercholesterolemia, unspecified: Secondary | ICD-10-CM | POA: Diagnosis not present

## 2021-06-12 DIAGNOSIS — E559 Vitamin D deficiency, unspecified: Secondary | ICD-10-CM | POA: Diagnosis not present

## 2021-06-12 DIAGNOSIS — Z0000 Encounter for general adult medical examination without abnormal findings: Secondary | ICD-10-CM | POA: Diagnosis not present

## 2021-06-12 DIAGNOSIS — F419 Anxiety disorder, unspecified: Secondary | ICD-10-CM | POA: Diagnosis not present

## 2021-06-12 DIAGNOSIS — J309 Allergic rhinitis, unspecified: Secondary | ICD-10-CM | POA: Diagnosis not present

## 2021-09-15 ENCOUNTER — Ambulatory Visit: Payer: BC Managed Care – PPO | Admitting: Orthopaedic Surgery

## 2021-09-16 ENCOUNTER — Ambulatory Visit: Payer: Self-pay

## 2021-09-16 ENCOUNTER — Ambulatory Visit (INDEPENDENT_AMBULATORY_CARE_PROVIDER_SITE_OTHER): Payer: BC Managed Care – PPO | Admitting: Orthopaedic Surgery

## 2021-09-16 ENCOUNTER — Encounter: Payer: Self-pay | Admitting: Orthopaedic Surgery

## 2021-09-16 ENCOUNTER — Other Ambulatory Visit: Payer: Self-pay

## 2021-09-16 DIAGNOSIS — M25552 Pain in left hip: Secondary | ICD-10-CM | POA: Diagnosis not present

## 2021-09-16 DIAGNOSIS — M7062 Trochanteric bursitis, left hip: Secondary | ICD-10-CM

## 2021-09-16 DIAGNOSIS — M5442 Lumbago with sciatica, left side: Secondary | ICD-10-CM

## 2021-09-16 MED ORDER — LIDOCAINE HCL 1 % IJ SOLN
3.0000 mL | INTRAMUSCULAR | Status: AC | PRN
  Administered 2021-09-16: 3 mL

## 2021-09-16 MED ORDER — METHYLPREDNISOLONE ACETATE 40 MG/ML IJ SUSP
40.0000 mg | INTRAMUSCULAR | Status: AC | PRN
  Administered 2021-09-16: 40 mg via INTRA_ARTICULAR

## 2021-09-16 NOTE — Progress Notes (Signed)
Office Visit Note   Patient: Patricia [name]           Date of Birth: 17-Jul-1961           MRN: [id number] Visit Date: 09/16/2021              Requested by: Juluis Rainier, MD [address] [place],  [place] [address] PCP: Juluis Rainier, MD   Assessment & Plan: Visit Diagnoses:  1. Pain in left hip   2. Trochanteric bursitis, left hip   3. Acute left-sided low back pain with left-sided sciatica     Plan: The patient signs and symptoms are more significant for left-sided sciatica than trochanteric bursitis.  I did place a steroid injection around the left trochanteric area to see if this would help but it is essential we send her to physical therapy to work on left-sided sciatica and trochanteric area with any modalities per the therapist discretion.  I would like to see her back in 3 weeks to see how she is doing overall.  If her symptoms worsen she will let us know because the next step after that if therapy does not work we will be obtaining an MRI of the lumbar spine.  Follow-Up Instructions: Return in about 3 weeks (around 10/07/2021).   Orders:  Orders Placed This Encounter  Procedures   Large Joint Inj   XR HIP UNILAT W OR W/O PELVIS 2-3 VIEWS LEFT   No orders of the defined types were placed in this encounter.     Procedures: Large Joint Inj: L greater trochanter on 09/16/2021 10:28 AM Indications: pain and diagnostic evaluation Details: 22 G 1.5 in needle, lateral approach  Arthrogram: No  Medications: 3 mL lidocaine 1 %; 40 mg methylPREDNISolone acetate 40 MG/ML Outcome: tolerated well, no immediate complications Procedure, treatment alternatives, risks and benefits explained, specific risks discussed. Consent was given by the patient. Immediately prior to procedure a time out was called to verify the correct patient, procedure, equipment, support staff and site/side marked as required. Patient was prepped and draped in the usual sterile fashion.       Clinical Data: No additional findings.   Subjective: Chief Complaint  Patient presents with   Left Hip - Pain  The patient comes in today for evaluation treatment of left hip pain and left-sided sciatic Pain going on for about a week now.  She was lifting up a dog and twisting and she has had acute pain on her left side since then.  She has just been on prednisone and had her last dose I believe yesterday.  She has tried diclofenac and Flexeril.  She has tried ice and heat.  The prednisone really has not helped her.  She denies any numbness and tingling in her feet and points to the trochanteric area of her left hip and the sciatic area of her left side is a source of her pain.  She is only 60 years old and very fit.  She says her left total knee has done great and has no issues.  HPI  Review of Systems There is no listed headache, chest pain, shortness of breath, fever, chills, nausea, vomiting  Objective: Vital Signs: There were no vitals taken for this visit.  Physical Exam She is alert and orient x3 and in no acute distress Ortho Exam Examination of her left hip shows it moves fluidly and fully with no pain in the groin at all.  There is pain to palpation of  the trochanteric area but also in the sciatic area.  She has a positive straight leg raise on the left side but good strength in her left lower extremity.  She is not walking with a limp or assistive ice.  Her left knee exam is normal. Specialty Comments:  No specialty comments available.  Imaging: XR HIP UNILAT W OR W/O PELVIS 2-3 VIEWS LEFT  Result Date: 09/16/2021 An AP pelvis and lateral left hip shows normal-appearing hips bilaterally.    PMFS History: Patient Active Problem List   Diagnosis Date Noted   Asymmetrical hearing loss of left ear 07/18/2019   Bilateral temporomandibular joint pain 07/18/2019   Eustachian tube dysfunction, left 07/17/2019   Hypoxia 02/14/2016   Acute dyspnea 02/12/2016    Dyspnea on exertion 02/12/2016   Osteoarthritis of left knee 01/30/2016   Status post total left knee replacement 01/30/2016   Past Medical History:  Diagnosis Date   Anxiety    Arthritis    oa   Complication of anesthesia    does not like needles, has passsed out with needle insertion   Dysplasia of cervix, low grade (CIN 1) 1987   cryo   Hyperlipemia    Loose body in joint    right knee    Family History  Problem Relation Age of Onset   Hypertension Mother 53   Alzheimer's disease Mother    Lymphoma Father    Cancer Father        brain tumor    Past Surgical History:  Procedure Laterality Date   AUGMENTATION MAMMAPLASTY     2009 ruptured silicone implants, replaced with saline 2009   EYE SURGERY Bilateral 01-13-2005   JOINT REPLACEMENT  12-13-2009   toe joint left big toe   KNEE SURGERY Left 12-12-14   lateral release and meniscus repair   LIPOSUCTION     TOTAL KNEE ARTHROPLASTY Left 01/30/2016   Procedure: LEFT TOTAL KNEE ARTHROPLASTY;  Surgeon: Kathryne Hitch, MD;  Location: [facility];  Service: Orthopedics;  Laterality: Left;   uterine ablation  07-14-2007   Social History   Occupational History   Not on file  Tobacco Use   Smoking status: Never   Smokeless tobacco: Never  Substance and Sexual Activity   Alcohol use: Yes    Alcohol/week: 4.0 - 6.0 standard drinks    Types: 4 - 6 Glasses of wine per week   Drug use: No   Sexual activity: Yes    Partners: Male    Birth control/protection: Post-menopausal    Comment: ablation

## 2021-09-25 ENCOUNTER — Encounter: Payer: Self-pay | Admitting: Rehabilitative and Restorative Service Providers"

## 2021-09-25 ENCOUNTER — Other Ambulatory Visit: Payer: Self-pay

## 2021-09-25 ENCOUNTER — Ambulatory Visit
Payer: BC Managed Care – PPO | Attending: Orthopaedic Surgery | Admitting: Rehabilitative and Restorative Service Providers"

## 2021-09-25 DIAGNOSIS — R252 Cramp and spasm: Secondary | ICD-10-CM | POA: Insufficient documentation

## 2021-09-25 DIAGNOSIS — M7062 Trochanteric bursitis, left hip: Secondary | ICD-10-CM | POA: Insufficient documentation

## 2021-09-25 DIAGNOSIS — R262 Difficulty in walking, not elsewhere classified: Secondary | ICD-10-CM | POA: Diagnosis not present

## 2021-09-25 DIAGNOSIS — M25552 Pain in left hip: Secondary | ICD-10-CM | POA: Insufficient documentation

## 2021-09-25 DIAGNOSIS — Z96652 Presence of left artificial knee joint: Secondary | ICD-10-CM | POA: Diagnosis not present

## 2021-09-25 DIAGNOSIS — M5442 Lumbago with sciatica, left side: Secondary | ICD-10-CM | POA: Insufficient documentation

## 2021-09-25 DIAGNOSIS — M6281 Muscle weakness (generalized): Secondary | ICD-10-CM

## 2021-09-25 NOTE — Patient Instructions (Signed)
Access Code: [email or web address] URL: [email or web address] Date: 09/25/2021 Prepared by: Clydie Braun Sona Nations  Exercises Supine Lower Trunk Rotation - 1-2 x daily - 7 x weekly - 1 sets - 5 reps - 15 sec hold Supine Double Knee to Chest Modified - 1-2 x daily - 7 x weekly - 1 sets - 3 reps - 20 sec hold Supine Hamstring Stretch - 1-2 x daily - 7 x weekly - 1 sets - 3 reps - 20 sec hold Supine Piriformis Stretch with Foot on Ground - 1-2 x daily - 7 x weekly - 1 sets - 3 reps - 20 sec hold Seated Quadratus Lumborum Stretch with Arm Overhead - 1-2 x daily - 7 x weekly - 1 sets - 3 reps - 20 sec hold Standing ITB Stretch - 1-2 x daily - 7 x weekly - 1 sets - 3 reps - 20 sec hold Sidelying IT Band Foam Roll Mobilization - 1-2 x daily - 7 x weekly - 1 sets - 10 reps

## 2021-09-25 NOTE — Therapy (Signed)
[facility] [address] [place], [place], [address] Phone: [phone]   Fax:  [phone]  Physical Therapy Evaluation  Patient Details  Name: Patricia [name] MRN: [id number] Date of Birth: December 04, 1961 Referring Provider (PT): Dr Doneen Poisson   Encounter Date: 09/25/2021   PT End of Session - 09/25/21 1143     Visit Number 1    Date for PT Re-Evaluation 10/30/21    Authorization Type BC/BS    PT Start Time 1100    PT Stop Time 1140    PT Time Calculation (min) 40 min    Activity Tolerance Patient tolerated treatment well    Behavior During Therapy [facility] for tasks assessed/performed             Past Medical History:  Diagnosis Date   Anxiety    Arthritis    oa   Complication of anesthesia    does not like needles, has passsed out with needle insertion   Dysplasia of cervix, low grade (CIN 1) 1987   cryo   Hyperlipemia    Loose body in joint    right knee    Past Surgical History:  Procedure Laterality Date   AUGMENTATION MAMMAPLASTY     2009 ruptured silicone implants, replaced with saline 2009   EYE SURGERY Bilateral 01-13-2005   JOINT REPLACEMENT  12-13-2009   toe joint left big toe   KNEE SURGERY Left 12-12-14   lateral release and meniscus repair   LIPOSUCTION     TOTAL KNEE ARTHROPLASTY Left 01/30/2016   Procedure: LEFT TOTAL KNEE ARTHROPLASTY;  Surgeon: Kathryne Hitch, MD;  Location: [facility];  Service: Orthopedics;  Laterality: Left;   uterine ablation  07-14-2007    There were no vitals filed for this visit.    Subjective Assessment - 09/25/21 1100     Subjective Pt reports that she has a small maltese that she had been leaning sideways to lift her dog up from the floor and she noticed some increased pain following.  She states that a few days later, she had increased pain.  Pt has been having this pain for approx 3 weeks.    Pertinent History L TKA in 2017    Limitations  Walking;Standing    How long can you stand comfortably? 5 min    How long can you walk comfortably? 5 min    Patient Stated Goals I would like to get a HEP that I could do at home to start feeling better.    Currently in Pain? Yes    Pain Score 7     Pain Location Hip    Pain Orientation Left    Pain Descriptors / Indicators Burning;Cramping    Pain Type Acute pain    Pain Onset 1 to 4 weeks ago    Pain Frequency Constant                [facility] PT Assessment - 09/25/21 0001       Assessment   Medical Diagnosis M70.62 (ICD-10-CM) - Trochanteric bursitis, left hip  M54.42 (ICD-10-CM) - Acute left-sided low back pain with left-sided sciatica    Referring Provider (PT) Dr Doneen Poisson    Hand Dominance Right    Next MD Visit 10/07/2021      Precautions   Precautions None      Restrictions   Weight Bearing Restrictions No      Balance Screen   Has the patient fallen  in the past 6 months No    Has the patient had a decrease in activity level because of a fear of falling?  No    Is the patient reluctant to leave their home because of a fear of falling?  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     ROM / Strength   AROM / PROM / Strength Strength      Strength   Overall Strength Comments L hip strenth of 4/5 grossly throughout      Transfers   Five time sit to  stand comments  10 sec without UE use                        Objective measurements completed on examination: See above findings.       [facility] Adult PT Treatment/Exercise - 09/25/21 0001       Exercises   Exercises Knee/Hip      Manual Therapy   Manual Therapy Soft tissue mobilization;Myofascial release    Soft tissue mobilization To L IT band, quad, piriformis    Myofascial Release trigger point release to L quad, L hip abductors, L piriformis              Trigger Point Dry Needling - 09/25/21 0001     Consent Given? Yes    Education Handout Provided Yes    Muscles Treated Lower Quadrant Quadriceps    Muscles Treated Back/Hip Tensor fascia lata;Piriformis    Quadriceps Response Twitch response elicited    Piriformis Response Twitch response elicited    Tensor Fascia Lata Response Twitch response elicited                   PT Education - 09/25/21 1136     Education Details Pt provided with HEP    Person(s) Educated Patient    Methods Explanation;Demonstration;Handout    Comprehension Verbalized understanding;Returned demonstration              PT Short Term Goals - 09/25/21 1154       PT SHORT TERM GOAL #1   Title be independent in initial HEP    Time 2    Period Weeks    Status New               PT Long Term Goals - 09/25/21 1154       PT LONG TERM GOAL #1   Title be independent in advanced HEP    Time 6    Period Weeks    Status New      PT LONG TERM GOAL #2   Title Increase FOTO to at least 64% to allow her to complete her functional activities with decreased pain.    Time 6    Period  Weeks    Status New      PT LONG TERM GOAL #3   Title Pt will be able to ambulate for at least 20 minutes without increase of pain.    Time 6    Period Weeks    Status New      PT LONG TERM GOAL #4   Title Pt will increase L hip strength to at least 4+/5 to allow her to perform her exercise routine.    Time 6    Period  Weeks    Status New                    Plan - 09/25/21 1144     Clinical Impression Statement Pt is a 60 y.o. female referred to outpatient PT by Dr Laural Golden secondary to L hip trochanteric bursitis and L sided low back pain with sciatica. Pt states that she has been having this pain for approximately 3 weeks and is uncertain of etiology apart from she had been bending over sideways to pick up her small dog several times recently. Pt presents with L hip pain and piriformis and IT band tightness with muscle weakness. Pt reports that she is having increased pain with walking and can only ambulate for approx 5 minutes without increased pain.  Pt educated about Dry Needling and she was interested in having service to assist wth decreased pain.  Following treatment, pt did report feeling increased mobility. Pt would benefit from skilled outpatient PT to address his functional impairments to allow her to return to her desired activities without pain.    Personal Factors and Comorbidities Comorbidity 1    Comorbidities L TKA in 2017    Examination-Activity Limitations Locomotion Level;Sleep;Stairs    Examination-Participation Restrictions Community Activity    Stability/Clinical Decision Making Stable/Uncomplicated    Clinical Decision Making Low    Rehab Potential Good    PT Frequency 2x / week    PT Duration 6 weeks    PT Treatment/Interventions ADLs/Self Care Home Management;Aquatic Therapy;Cryotherapy;Electrical Stimulation;Iontophoresis 4mg /ml Dexamethasone;Moist Heat;Traction;Ultrasound;Gait training;Stair training;Functional mobility training;Therapeutic activities;Therapeutic exercise;Balance training;Patient/family education;Manual techniques;Passive range of motion;Dry needling;Taping;Spinal Manipulations;Joint Manipulations    PT Next Visit Plan assess and progress HEP.  Flexibility, strengthening    PT Home Exercise Plan Access Code [email or web address]    Consulted and Agree with  Plan of Care Patient             Patient will benefit from skilled therapeutic intervention in order to improve the following deficits and impairments:  Difficulty walking, Increased fascial restricitons, Increased muscle spasms, Pain, Impaired flexibility, Decreased strength  Visit Diagnosis: Pain in left hip - Plan: PT plan of care cert/re-cert  Muscle weakness (generalized) - Plan: PT plan of care cert/re-cert  Cramp and spasm - Plan: PT plan of care cert/re-cert  Difficulty in walking, not elsewhere classified - Plan: PT plan of care cert/re-cert     Problem List Patient Active Problem List   Diagnosis Date Noted   Asymmetrical hearing loss of left ear 07/18/2019   Bilateral temporomandibular joint pain 07/18/2019   Eustachian tube dysfunction, left 07/17/2019   Hypoxia 02/14/2016   Acute dyspnea 02/12/2016   Dyspnea on exertion 02/12/2016   Osteoarthritis of left knee 01/30/2016   Status post total left knee replacement 01/30/2016    02/01/2016, PT, DPT 09/25/2021, 12:03 PM  [facility] @ [facility] [address] [place], [place], [place] Phone: [phone]   Fax:  [phone]  Name: Patricia [name] MRN: [id number] Date of Birth: 12/10/1961

## 2021-09-29 ENCOUNTER — Other Ambulatory Visit: Payer: Self-pay

## 2021-09-29 ENCOUNTER — Ambulatory Visit: Payer: BC Managed Care – PPO | Admitting: Rehabilitative and Restorative Service Providers"

## 2021-09-29 ENCOUNTER — Encounter: Payer: Self-pay | Admitting: Rehabilitative and Restorative Service Providers"

## 2021-09-29 DIAGNOSIS — M7062 Trochanteric bursitis, left hip: Secondary | ICD-10-CM | POA: Diagnosis not present

## 2021-09-29 DIAGNOSIS — Z96652 Presence of left artificial knee joint: Secondary | ICD-10-CM | POA: Diagnosis not present

## 2021-09-29 DIAGNOSIS — R252 Cramp and spasm: Secondary | ICD-10-CM | POA: Diagnosis not present

## 2021-09-29 DIAGNOSIS — R262 Difficulty in walking, not elsewhere classified: Secondary | ICD-10-CM

## 2021-09-29 DIAGNOSIS — M5442 Lumbago with sciatica, left side: Secondary | ICD-10-CM | POA: Diagnosis not present

## 2021-09-29 DIAGNOSIS — M25552 Pain in left hip: Secondary | ICD-10-CM

## 2021-09-29 DIAGNOSIS — M6281 Muscle weakness (generalized): Secondary | ICD-10-CM

## 2021-09-29 NOTE — Therapy (Signed)
[facility] @ [place], [place], [address] Phone: [phone]   Fax:  [phone]  Physical Therapy Treatment  Patient Details  Name: Patricia [name] MRN: [id number] Date of Birth: [birth year] Referring Provider (PT): Dr Jean Rosenthal   Encounter Date: 09/29/2021   PT End of Session - 09/29/21 1233     Visit Number 2    Date for PT Re-Evaluation 10/30/21    Authorization Type BC/BS    PT Start Time 1230    PT Stop Time 1310    PT Time Calculation (min) 40 min    Activity Tolerance Patient tolerated treatment well    Behavior During Therapy [facility] for tasks assessed/performed             Past Medical History:  Diagnosis Date   Anxiety    Arthritis    oa   Complication of anesthesia    does not like needles, has passsed out with needle insertion   Dysplasia of cervix, low grade (CIN 1) 1987   cryo   Hyperlipemia    Loose body in joint    right knee    Past Surgical History:  Procedure Laterality Date   AUGMENTATION MAMMAPLASTY     2703 ruptured silicone implants, replaced with saline 2009   EYE SURGERY Bilateral 01-13-2005   JOINT REPLACEMENT  12-13-2009   toe joint left big toe   KNEE SURGERY Left 12-12-14   lateral release and meniscus repair   LIPOSUCTION     TOTAL KNEE ARTHROPLASTY Left 01/30/2016   Procedure: LEFT TOTAL KNEE ARTHROPLASTY;  Surgeon: Mcarthur Rossetti, MD;  Location: [facility];  Service: Orthopedics;  Laterality: Left;   uterine ablation  07-14-2007    There were no vitals filed for this visit.   Subjective Assessment - 09/29/21 1232     Subjective The DN really helped.    Patient Stated Goals I would like to get a HEP that I could do at home to start feeling better.    Currently in Pain? Yes    Pain Score 3     Pain Location Hip    Pain Orientation Left    Pain Descriptors / Indicators Aching;Sore    Pain Type Acute pain    Aggravating Factors  pain  continues to be worse at nighttime                               [facility] Adult PT Treatment/Exercise - 09/29/21 0001       High Level Balance   High Level Balance Comments Side stepping with red tband x2 laps down gym and back.      Knee/Hip Exercises: Aerobic   Nustep L5 x6 min      Knee/Hip Exercises: Machines for Strengthening   Cybex Leg Press 55# 2x10    Hip Cybex 3 way 40# x10 each B      Knee/Hip Exercises: Standing   Lateral Step Up Both;1 set;10 reps;Hand Hold: 0;Step Height: 6"    Forward Step Up Both;1 set;10 reps;Hand Hold: 0;Step Height: 6"      Knee/Hip Exercises: Sidelying   Clams red tband 2x10 LLE      Manual Therapy   Manual Therapy Soft tissue mobilization;Myofascial release    Manual therapy comments in sidelying    Soft tissue mobilization To L IT band    Myofascial Release manual TPR to L lower  IT band.  Addaday x3 min to L IT band.                       PT Short Term Goals - 09/29/21 1325       PT SHORT TERM GOAL #1   Title be independent in initial HEP    Status Partially Met               PT Long Term Goals - 09/25/21 1154       PT LONG TERM GOAL #1   Title be independent in advanced HEP    Time 6    Period Weeks    Status New      PT LONG TERM GOAL #2   Title Increase FOTO to at least 64% to allow her to complete her functional activities with decreased pain.    Time 6    Period Weeks    Status New      PT LONG TERM GOAL #3   Title Pt will be able to ambulate for at least 20 minutes without increase of pain.    Time 6    Period Weeks    Status New      PT LONG TERM GOAL #4   Title Pt will increase L hip strength to at least 4+/5 to allow her to perform her exercise routine.    Time 6    Period Weeks    Status New                   Plan - 09/29/21 1317     Clinical Impression Statement Ms Mokry is progressing well with goal related activities. Following DN last session, pt  reports that she was feeling much better, however does still have some pain towards the end of the day when lying down. Pt wanted to try the Addaday as her son had recommended it, but she states that she did not like it as much as the rolling pin. Pt demonstrates improved hip flexibility with ambulation outside over various inclines. She continues to require skilled PT to progress towards goal related activities.    PT Treatment/Interventions ADLs/Self Care Home Management;Aquatic Therapy;Cryotherapy;Electrical Stimulation;Iontophoresis 4mg /ml Dexamethasone;Moist Heat;Traction;Ultrasound;Gait training;Stair training;Functional mobility training;Therapeutic activities;Therapeutic exercise;Balance training;Patient/family education;Manual techniques;Passive range of motion;Dry needling;Taping;Spinal Manipulations;Joint Manipulations    PT Next Visit Plan assess and progress HEP.  Flexibility, strengthening    PT Home Exercise Plan Access Code [email or web address].  Added sidelying clamshells and side stepping with red tband.    Consulted and Agree with Plan of Care Patient             Patient will benefit from skilled therapeutic intervention in order to improve the following deficits and impairments:  Difficulty walking, Increased fascial restricitons, Increased muscle spasms, Pain, Impaired flexibility, Decreased strength  Visit Diagnosis: Pain in left hip  Muscle weakness (generalized)  Cramp and spasm  Difficulty in walking, not elsewhere classified     Problem List Patient Active Problem List   Diagnosis Date Noted   Asymmetrical hearing loss of left ear 07/18/2019   Bilateral temporomandibular joint pain 07/18/2019   Eustachian tube dysfunction, left 07/17/2019   Hypoxia 02/14/2016   Acute dyspnea 02/12/2016   Dyspnea on exertion 02/12/2016   Osteoarthritis of left knee 01/30/2016   Status post total left knee replacement 01/30/2016    Patricia [name], PT, DPT 09/29/2021, 1:26 PM  [facility] @ [facility] [place]  [place], [place], [address] Phone: [phone]   Fax:  [phone]  Name: Patricia [name] MRN: [id number] Date of Birth: [birth year]

## 2021-10-01 ENCOUNTER — Ambulatory Visit: Payer: BC Managed Care – PPO | Admitting: Orthopaedic Surgery

## 2021-10-02 ENCOUNTER — Encounter: Payer: BC Managed Care – PPO | Admitting: Rehabilitative and Restorative Service Providers"

## 2021-10-06 ENCOUNTER — Encounter: Payer: Self-pay | Admitting: Rehabilitative and Restorative Service Providers"

## 2021-10-06 ENCOUNTER — Ambulatory Visit: Payer: BC Managed Care – PPO | Admitting: Rehabilitative and Restorative Service Providers"

## 2021-10-06 ENCOUNTER — Other Ambulatory Visit: Payer: Self-pay

## 2021-10-06 DIAGNOSIS — R252 Cramp and spasm: Secondary | ICD-10-CM | POA: Diagnosis not present

## 2021-10-06 DIAGNOSIS — Z96652 Presence of left artificial knee joint: Secondary | ICD-10-CM | POA: Diagnosis not present

## 2021-10-06 DIAGNOSIS — R262 Difficulty in walking, not elsewhere classified: Secondary | ICD-10-CM

## 2021-10-06 DIAGNOSIS — M7062 Trochanteric bursitis, left hip: Secondary | ICD-10-CM | POA: Diagnosis not present

## 2021-10-06 DIAGNOSIS — M5442 Lumbago with sciatica, left side: Secondary | ICD-10-CM | POA: Diagnosis not present

## 2021-10-06 DIAGNOSIS — M6281 Muscle weakness (generalized): Secondary | ICD-10-CM

## 2021-10-06 DIAGNOSIS — M25552 Pain in left hip: Secondary | ICD-10-CM

## 2021-10-06 NOTE — Therapy (Signed)
[facility] @ [address], [place], [address] Phone: [phone]   Fax:  [phone]  Physical Therapy Treatment  Patient Details  Name: Patricia [name] MRN: [id number] Date of Birth: July 23, 1961 Referring Provider ([name]): Dr Jean Rosenthal   Encounter Date: 10/06/2021   [name] End of Session - 10/06/21 1230     Visit Number 3    Date for [name] Re-Evaluation 10/30/21    Authorization Type BC/BS    [name] Start Time 1226    [name] Stop Time 3154    [name] Time Calculation (min) 39 min    Activity Tolerance Patient tolerated treatment well    Behavior During Therapy [facility] for tasks assessed/performed             Past Medical History:  Diagnosis Date   Anxiety    Arthritis    oa   Complication of anesthesia    does not like needles, has passsed out with needle insertion   Dysplasia of cervix, low grade (CIN 1) 1987   cryo   Hyperlipemia    Loose body in joint    right knee    Past Surgical History:  Procedure Laterality Date   AUGMENTATION MAMMAPLASTY     0086 ruptured silicone implants, replaced with saline 2009   EYE SURGERY Bilateral 01-13-2005   JOINT REPLACEMENT  12-13-2009   toe joint left big toe   KNEE SURGERY Left 12-12-14   lateral release and meniscus repair   LIPOSUCTION     TOTAL KNEE ARTHROPLASTY Left 01/30/2016   Procedure: LEFT TOTAL KNEE ARTHROPLASTY;  Surgeon: Mcarthur Rossetti, MD;  Location: [facility];  Service: Orthopedics;  Laterality: Left;   uterine ablation  07-14-2007    There were no vitals filed for this visit.   Subjective Assessment - 10/06/21 1229     Subjective I went shopping for about 2 hours on Saturday.  I am doing a lot better.  I think today will be my last day.    Patient Stated Goals I would like to get a HEP that I could do at home to start feeling better.    Currently in Pain? Yes    Pain Score 1     Pain Location Hip    Pain Orientation Left    Pain Descriptors /  Indicators Sore                [facility] [name] Assessment - 10/06/21 0001       Assessment   Medical Diagnosis M70.62 (ICD-10-CM) - Trochanteric bursitis, left hip  M54.42 (ICD-10-CM) - Acute left-sided low back pain with left-sided sciatica    Referring Provider ([name]) Dr Jean Rosenthal      Observation/Other Assessments   Focus on Therapeutic Outcomes (FOTO)  67%      Strength   Overall Strength Comments L hip strength of 5/5 grossly throughout                           Patricia [name] Treatment/Exercise - 10/06/21 0001       Ambulation/Gait   Gait Comments Amb outside independently over various surfaces and inclines and navigating curbs without increased pain      High Level Balance   High Level Balance Comments Standing on foam with single leg performing ball toss BLE      Knee/Hip Exercises: Aerobic   Nustep L5 x6 min with [name] present for discussing progress  Knee/Hip Exercises: Machines for Strengthening   Cybex Leg Press 55# 2x10      Knee/Hip Exercises: Standing   Hip Flexion Stengthening;Both;2 sets;10 reps    Hip Flexion Limitations green tband    Hip Abduction Stengthening;Both;2 sets;10 reps    Abduction Limitations green tband    Hip Extension Stengthening;Both;2 sets;10 reps    Extension Limitations green tband    Lateral Step Up Both;1 set;10 reps    Lateral Step Up Limitations step up on bosu    Forward Step Up Both;1 set;10 reps    Forward Step Up Limitations side step up and over bosu      Knee/Hip Exercises: Seated   Clamshell with TheraBand Green   2x10   Sit to Sand 10 reps;without UE support   x10 with yellow wt ball chest press, x10 with yellow wt ball overhead press                      [name] Short Term Goals - 10/06/21 1311       [name] SHORT TERM GOAL #1   Title be independent in initial HEP    Status Achieved               [name] Long Term Goals - 10/06/21 1312       [name] LONG TERM GOAL #1   Title be  independent in advanced HEP    Status Achieved      [name] LONG TERM GOAL #2   Title Increase FOTO to at least 64% to allow her to complete her functional activities with decreased pain.    Status Achieved      [name] LONG TERM GOAL #3   Title [name] will be able to ambulate for at least 20 minutes without increase of pain.    Status Achieved      [name] LONG TERM GOAL #4   Title [name] will increase L hip strength to at least 4+/5 to allow her to perform her exercise routine.    Status Achieved                   Plan - 10/06/21 1309     Clinical Impression Statement Patricia [name] has progressed well with skilled [name] and is ready for discharge from services. [name] to continue with HEP and has green theraband to progress at home. [name] able to ambulate outside over various surfaces without increased pain and with good safety. [name] has incrased FOTO to above predicted value. [name] discharged from outpatient [name] to continue with HEP.    [name] Treatment/Interventions ADLs/Self Care Home Management;Aquatic Therapy;Cryotherapy;Electrical Stimulation;Iontophoresis 36m/ml Dexamethasone;Moist Heat;Traction;Ultrasound;Gait training;Stair training;Functional mobility training;Therapeutic activities;Therapeutic exercise;Balance training;Patient/family education;Manual techniques;Passive range of motion;Dry needling;Taping;Spinal Manipulations;Joint Manipulations    [name] Next Visit Plan Discharge, continue with HEP    Consulted and Agree with Plan of Care Patient             Patient will benefit from skilled therapeutic intervention in order to improve the following deficits and impairments:  Impaired flexibility  Visit Diagnosis: Pain in left hip  Muscle weakness (generalized)  Difficulty in walking, not elsewhere classified     Problem List Patient Active Problem List   Diagnosis Date Noted   Asymmetrical hearing loss of left ear 07/18/2019   Bilateral temporomandibular joint pain 07/18/2019   Eustachian tube  dysfunction, left 07/17/2019   Hypoxia 02/14/2016   Acute dyspnea 02/12/2016   Dyspnea on exertion 02/12/2016   Osteoarthritis of left knee  01/30/2016   Status post total left knee replacement 01/30/2016   PHYSICAL THERAPY DISCHARGE SUMMARY  Patient agrees to discharge. Patient goals were met. Patient is being discharged due to meeting the stated rehab goals.   Juel Burrow, [name], DPT 10/06/2021, 1:16 PM  [facility] @ [address] [place], [place], [address] Phone: [phone]   Fax:  [phone]  Name: GWENDLYN HANBACK MRN: [id number] Date of Birth: Feb 14, 1961

## 2021-10-07 ENCOUNTER — Ambulatory Visit: Payer: BC Managed Care – PPO | Admitting: Orthopaedic Surgery

## 2021-10-13 ENCOUNTER — Encounter: Payer: BC Managed Care – PPO | Admitting: Rehabilitative and Restorative Service Providers"

## 2021-10-16 ENCOUNTER — Encounter: Payer: BC Managed Care – PPO | Admitting: Physical Therapy

## 2021-10-19 ENCOUNTER — Ambulatory Visit: Payer: BC Managed Care – PPO | Admitting: Orthopaedic Surgery

## 2021-10-20 ENCOUNTER — Encounter: Payer: BC Managed Care – PPO | Admitting: Rehabilitative and Restorative Service Providers"

## 2021-10-23 ENCOUNTER — Encounter: Payer: BC Managed Care – PPO | Admitting: Physical Therapy

## 2021-10-27 ENCOUNTER — Encounter: Payer: BC Managed Care – PPO | Admitting: Rehabilitative and Restorative Service Providers"

## 2021-10-30 ENCOUNTER — Encounter: Payer: BC Managed Care – PPO | Admitting: Physical Therapy

## 2021-11-13 DIAGNOSIS — Z1231 Encounter for screening mammogram for malignant neoplasm of breast: Secondary | ICD-10-CM | POA: Diagnosis not present

## 2022-03-29 DIAGNOSIS — L309 Dermatitis, unspecified: Secondary | ICD-10-CM | POA: Diagnosis not present

## 2022-06-25 DIAGNOSIS — Z79899 Other long term (current) drug therapy: Secondary | ICD-10-CM | POA: Diagnosis not present

## 2022-06-25 DIAGNOSIS — E7800 Pure hypercholesterolemia, unspecified: Secondary | ICD-10-CM | POA: Diagnosis not present

## 2022-07-02 DIAGNOSIS — E559 Vitamin D deficiency, unspecified: Secondary | ICD-10-CM | POA: Diagnosis not present

## 2022-07-02 DIAGNOSIS — Z23 Encounter for immunization: Secondary | ICD-10-CM | POA: Diagnosis not present

## 2022-07-02 DIAGNOSIS — Z0000 Encounter for general adult medical examination without abnormal findings: Secondary | ICD-10-CM | POA: Diagnosis not present

## 2022-07-02 DIAGNOSIS — E7800 Pure hypercholesterolemia, unspecified: Secondary | ICD-10-CM | POA: Diagnosis not present

## 2022-07-02 DIAGNOSIS — J309 Allergic rhinitis, unspecified: Secondary | ICD-10-CM | POA: Diagnosis not present

## 2022-07-02 DIAGNOSIS — F419 Anxiety disorder, unspecified: Secondary | ICD-10-CM | POA: Diagnosis not present

## 2022-07-28 DIAGNOSIS — Z5181 Encounter for therapeutic drug level monitoring: Secondary | ICD-10-CM | POA: Diagnosis not present

## 2022-07-28 DIAGNOSIS — E7800 Pure hypercholesterolemia, unspecified: Secondary | ICD-10-CM | POA: Diagnosis not present

## 2022-07-28 DIAGNOSIS — I10 Essential (primary) hypertension: Secondary | ICD-10-CM | POA: Diagnosis not present

## 2022-10-12 DIAGNOSIS — Z808 Family history of malignant neoplasm of other organs or systems: Secondary | ICD-10-CM | POA: Diagnosis not present

## 2022-10-12 DIAGNOSIS — L309 Dermatitis, unspecified: Secondary | ICD-10-CM | POA: Diagnosis not present

## 2022-10-12 DIAGNOSIS — D485 Neoplasm of uncertain behavior of skin: Secondary | ICD-10-CM | POA: Diagnosis not present

## 2022-10-20 ENCOUNTER — Encounter: Payer: Self-pay | Admitting: Dermatology

## 2022-10-22 ENCOUNTER — Other Ambulatory Visit: Payer: Self-pay | Admitting: Dermatology

## 2022-10-22 ENCOUNTER — Ambulatory Visit
Admission: RE | Admit: 2022-10-22 | Discharge: 2022-10-22 | Disposition: A | Discharge status: Home / Self Care | Payer: BC Managed Care – PPO | Source: Ambulatory Visit | Attending: Dermatology | Admitting: Dermatology

## 2022-10-22 DIAGNOSIS — D869 Sarcoidosis, unspecified: Secondary | ICD-10-CM | POA: Diagnosis not present

## 2022-10-22 DIAGNOSIS — Z9882 Breast implant status: Secondary | ICD-10-CM | POA: Diagnosis not present

## 2022-10-22 DIAGNOSIS — R921 Mammographic calcification found on diagnostic imaging of breast: Secondary | ICD-10-CM | POA: Diagnosis not present

## 2022-11-08 DIAGNOSIS — F419 Anxiety disorder, unspecified: Secondary | ICD-10-CM | POA: Diagnosis not present

## 2022-11-08 DIAGNOSIS — E7800 Pure hypercholesterolemia, unspecified: Secondary | ICD-10-CM | POA: Diagnosis not present

## 2022-11-08 DIAGNOSIS — D863 Sarcoidosis of skin: Secondary | ICD-10-CM | POA: Diagnosis not present

## 2022-11-08 DIAGNOSIS — Z5181 Encounter for therapeutic drug level monitoring: Secondary | ICD-10-CM | POA: Diagnosis not present

## 2022-11-08 DIAGNOSIS — I10 Essential (primary) hypertension: Secondary | ICD-10-CM | POA: Diagnosis not present

## 2022-11-19 DIAGNOSIS — Z1231 Encounter for screening mammogram for malignant neoplasm of breast: Secondary | ICD-10-CM | POA: Diagnosis not present

## 2023-06-29 DIAGNOSIS — E7800 Pure hypercholesterolemia, unspecified: Secondary | ICD-10-CM | POA: Diagnosis not present

## 2023-06-29 DIAGNOSIS — E559 Vitamin D deficiency, unspecified: Secondary | ICD-10-CM | POA: Diagnosis not present

## 2023-06-29 DIAGNOSIS — I10 Essential (primary) hypertension: Secondary | ICD-10-CM | POA: Diagnosis not present

## 2023-07-06 DIAGNOSIS — F419 Anxiety disorder, unspecified: Secondary | ICD-10-CM | POA: Diagnosis not present

## 2023-07-06 DIAGNOSIS — J309 Allergic rhinitis, unspecified: Secondary | ICD-10-CM | POA: Diagnosis not present

## 2023-07-06 DIAGNOSIS — E7800 Pure hypercholesterolemia, unspecified: Secondary | ICD-10-CM | POA: Diagnosis not present

## 2023-07-06 DIAGNOSIS — I10 Essential (primary) hypertension: Secondary | ICD-10-CM | POA: Diagnosis not present

## 2023-07-06 DIAGNOSIS — Z0000 Encounter for general adult medical examination without abnormal findings: Secondary | ICD-10-CM | POA: Diagnosis not present

## 2023-07-27 ENCOUNTER — Other Ambulatory Visit (INDEPENDENT_AMBULATORY_CARE_PROVIDER_SITE_OTHER): Payer: BC Managed Care – PPO

## 2023-07-27 ENCOUNTER — Ambulatory Visit (INDEPENDENT_AMBULATORY_CARE_PROVIDER_SITE_OTHER): Payer: BC Managed Care – PPO | Admitting: Orthopaedic Surgery

## 2023-07-27 ENCOUNTER — Encounter: Payer: Self-pay | Admitting: Orthopaedic Surgery

## 2023-07-27 DIAGNOSIS — Z96652 Presence of left artificial knee joint: Secondary | ICD-10-CM | POA: Diagnosis not present

## 2023-07-27 DIAGNOSIS — M25561 Pain in right knee: Secondary | ICD-10-CM

## 2023-07-27 DIAGNOSIS — G8929 Other chronic pain: Secondary | ICD-10-CM | POA: Diagnosis not present

## 2023-07-27 MED ORDER — METHYLPREDNISOLONE ACETATE 40 MG/ML IJ SUSP
40.0000 mg | INTRAMUSCULAR | Status: AC | PRN
  Administered 2023-07-27: 40 mg via INTRA_ARTICULAR

## 2023-07-27 MED ORDER — LIDOCAINE HCL 1 % IJ SOLN
3.0000 mL | INTRAMUSCULAR | Status: AC | PRN
  Administered 2023-07-27: 3 mL

## 2023-07-27 NOTE — Progress Notes (Signed)
The patient is someone is now 7 years status post a left total knee arthroplasty.  It has been bothering her little bit lately and she reports some mild swelling.  She denies any injury.  She denies any sicknesses or illnesses.  She is a thin individual and not a diabetic.  She has also had some knee pain around her right knee patella.  That is a native knee.  She is an active individual and appears much younger than her age of 53.  Examination of both knees today shows no effusion.  Her right knee does have patellofemoral crepitation and pain around the patella.  No medial or lateral joint line tenderness.  The right knee has full range of motion and is ligamentously stable.  Her left operative knee shows no effusion today.  It is ligamentously stable with full range of motion.  There is just some slight play in the knee and this may be bites following her.  I would like her to try significant quad strengthening exercises to really strengthen the muscles around the knees.  X-rays of of the right knee shows significant patellofemoral chondromalacia.  X-rays of the left knee shows a well-seated total knee arthroplasty with no complicating features.  I did recommend a steroid injection in her right knee and she agreed to this and tolerated well.  If things worsen she will let us know.  Follow-up otherwise is as needed.  All question concerns were answered and addressed.     Procedure Note  Patient: Patricia [name]             Date of Birth: November 28, 1961           MRN: [id number]             Visit Date: 07/27/2023  Procedures: Visit Diagnoses:  1. History of left knee replacement   2. Chronic pain of right knee     Large Joint Inj: R knee on 07/27/2023 10:32 AM Indications: diagnostic evaluation and pain Details: 22 G 1.5 in needle, superolateral approach  Arthrogram: No  Medications: 3 mL lidocaine 1 %; 40 mg methylPREDNISolone acetate 40 MG/ML Outcome: tolerated well, no immediate  complications Procedure, treatment alternatives, risks and benefits explained, specific risks discussed. Consent was given by the patient. Immediately prior to procedure a time out was called to verify the correct patient, procedure, equipment, support staff and site/side marked as required. Patient was prepped and draped in the usual sterile fashion.

## 2023-11-30 DIAGNOSIS — Z1231 Encounter for screening mammogram for malignant neoplasm of breast: Secondary | ICD-10-CM | POA: Diagnosis not present

## 2024-01-27 DIAGNOSIS — H04123 Dry eye syndrome of bilateral lacrimal glands: Secondary | ICD-10-CM | POA: Diagnosis not present

## 2024-01-27 DIAGNOSIS — Z961 Presence of intraocular lens: Secondary | ICD-10-CM | POA: Diagnosis not present

## 2024-01-27 DIAGNOSIS — H524 Presbyopia: Secondary | ICD-10-CM | POA: Diagnosis not present

## 2024-06-10 DIAGNOSIS — R509 Fever, unspecified: Secondary | ICD-10-CM | POA: Diagnosis not present

## 2024-06-10 DIAGNOSIS — J029 Acute pharyngitis, unspecified: Secondary | ICD-10-CM | POA: Diagnosis not present

## 2024-06-10 DIAGNOSIS — R051 Acute cough: Secondary | ICD-10-CM | POA: Diagnosis not present

## 2024-06-10 DIAGNOSIS — Z03818 Encounter for observation for suspected exposure to other biological agents ruled out: Secondary | ICD-10-CM | POA: Diagnosis not present

## 2024-06-10 DIAGNOSIS — U071 COVID-19: Secondary | ICD-10-CM | POA: Diagnosis not present

## 2024-06-28 ENCOUNTER — Ambulatory Visit: Admitting: Orthopaedic Surgery

## 2024-07-05 DIAGNOSIS — I10 Essential (primary) hypertension: Secondary | ICD-10-CM | POA: Diagnosis not present

## 2024-07-05 DIAGNOSIS — E7800 Pure hypercholesterolemia, unspecified: Secondary | ICD-10-CM | POA: Diagnosis not present

## 2024-07-05 DIAGNOSIS — E559 Vitamin D deficiency, unspecified: Secondary | ICD-10-CM | POA: Diagnosis not present

## 2024-07-09 DIAGNOSIS — I10 Essential (primary) hypertension: Secondary | ICD-10-CM | POA: Diagnosis not present

## 2024-07-09 DIAGNOSIS — J309 Allergic rhinitis, unspecified: Secondary | ICD-10-CM | POA: Diagnosis not present

## 2024-07-09 DIAGNOSIS — Z124 Encounter for screening for malignant neoplasm of cervix: Secondary | ICD-10-CM | POA: Diagnosis not present

## 2024-07-09 DIAGNOSIS — Z0000 Encounter for general adult medical examination without abnormal findings: Secondary | ICD-10-CM | POA: Diagnosis not present

## 2024-07-09 DIAGNOSIS — F419 Anxiety disorder, unspecified: Secondary | ICD-10-CM | POA: Diagnosis not present

## 2024-07-09 DIAGNOSIS — E7800 Pure hypercholesterolemia, unspecified: Secondary | ICD-10-CM | POA: Diagnosis not present

## 2024-08-01 ENCOUNTER — Ambulatory Visit (INDEPENDENT_AMBULATORY_CARE_PROVIDER_SITE_OTHER): Admitting: Orthopaedic Surgery

## 2024-08-01 ENCOUNTER — Other Ambulatory Visit (INDEPENDENT_AMBULATORY_CARE_PROVIDER_SITE_OTHER): Payer: Self-pay

## 2024-08-01 DIAGNOSIS — G8929 Other chronic pain: Secondary | ICD-10-CM

## 2024-08-01 DIAGNOSIS — M25561 Pain in right knee: Secondary | ICD-10-CM

## 2024-08-01 MED ORDER — LIDOCAINE HCL 1 % IJ SOLN
3.0000 mL | INTRAMUSCULAR | Status: AC | PRN
  Administered 2024-08-01: 3 mL

## 2024-08-01 MED ORDER — METHYLPREDNISOLONE ACETATE 40 MG/ML IJ SUSP
40.0000 mg | INTRAMUSCULAR | Status: AC | PRN
  Administered 2024-08-01: 40 mg via INTRA_ARTICULAR

## 2024-08-01 NOTE — Progress Notes (Signed)
 The patient is well-known to us .  Is been a year since we saw her last for her right knee.  She is only 63 years old and does have known arthritis of the right knee.  We actually replaced her left knee back in 2017 and has done well.  She feels like the kneecap is sliding around on her on the right knee and there is a lot of grinding when going up and down stairs as well as popping.  She has been having some left heel pain as well on the bottom of her heel and around the Achilles.  She does wear open back shoes and flip-flops a lot during the summer.  She does let us  know that a steroid injection was placed in her right knee last year has helped up until about a month ago.  Examination of the right knee shows no definite effusion today.  There is a lot of patellofemoral crepitation and grinding throughout flexion and extension.  There is medial joint line tenderness.  The knee is ligamentously stable.  She does have pain over the Achilles area of her left foot.  I talked her about treatment methods for left plantar fasciitis.  Once she wears firmer shoes and works on stretching her foot that will help her quite a bit.  From a knee standpoint, we did offer an aspiration and steroid injection in the right knee today and she was also requesting a steroid injection.  We did offer this for the left foot but she is going to try stretching first.  I ended up only getting a small amount of fluid out of her right knee but did place a steroid injection in the knee which she tolerated well.  If things worsen she knows to come back and see us  anytime.  X-rays would not be needed.    Procedure Note  Patient: Patricia [name]             Date of Birth: 11/24/1961           MRN: [id number]             Visit Date: 08/01/2024  Procedures: Visit Diagnoses:  1. Chronic pain of right knee     Large Joint Inj: R knee on 08/01/2024 2:30 PM Indications: diagnostic evaluation and pain Details: 22 G 1.5 in needle,  superolateral approach  Arthrogram: No  Medications: 3 mL lidocaine  1 %; 40 mg methylPREDNISolone  acetate 40 MG/ML Outcome: tolerated well, no immediate complications Procedure, treatment alternatives, risks and benefits explained, specific risks discussed. Consent was given by the patient. Immediately prior to procedure a time out was called to verify the correct patient, procedure, equipment, support staff and site/side marked as required. Patient was prepped and draped in the usual sterile fashion.

## 2024-08-16 ENCOUNTER — Ambulatory Visit: Admitting: Orthopedic Surgery

## 2024-08-22 ENCOUNTER — Ambulatory Visit: Admitting: Orthopaedic Surgery

## 2024-08-27 ENCOUNTER — Ambulatory Visit: Admitting: Orthopaedic Surgery

## 2024-10-15 ENCOUNTER — Encounter: Payer: Self-pay | Admitting: Radiology

## 2024-10-16 ENCOUNTER — Other Ambulatory Visit: Payer: Self-pay

## 2024-10-16 ENCOUNTER — Telehealth: Payer: Self-pay | Admitting: Orthopaedic Surgery

## 2024-10-16 DIAGNOSIS — M25552 Pain in left hip: Secondary | ICD-10-CM

## 2024-10-16 NOTE — Therapy (Signed)
 OUTPATIENT PHYSICAL THERAPY LOWER EXTREMITY EVALUATION   Patient Name: Patricia [name] MRN: [id number] DOB:Jun 18, 1961, 63 y.o., female Today's Date: 10/17/2024  END OF SESSION:  PT End of Session - 10/17/24 0752     Visit Number 1    Date for Recertification  12/12/24    Authorization Type BCBS auth pending    PT Start Time 0752    PT Stop Time 0844    PT Time Calculation (min) 52 min    Activity Tolerance Patient tolerated treatment well    Behavior During Therapy [facility] for tasks assessed/performed          Past Medical History:  Diagnosis Date   Anxiety    Arthritis    oa   Complication of anesthesia    does not like needles, has passsed out with needle insertion   Dysplasia of cervix, low grade (CIN 1) 1987   cryo   Hyperlipemia    Loose body in joint    right knee   Past Surgical History:  Procedure Laterality Date   AUGMENTATION MAMMAPLASTY     2009 ruptured silicone implants, replaced with saline 2009   EYE SURGERY Bilateral 01-13-2005   JOINT REPLACEMENT  12-13-2009   toe joint left big toe   KNEE SURGERY Left 12-12-14   lateral release and meniscus repair   LIPOSUCTION     TOTAL KNEE ARTHROPLASTY Left 01/30/2016   Procedure: LEFT TOTAL KNEE ARTHROPLASTY;  Surgeon: Lonni CINDERELLA Poli, MD;  Location: [facility];  Service: Orthopedics;  Laterality: Left;   uterine ablation  07-14-2007   Patient Active Problem List   Diagnosis Date Noted   Asymmetrical hearing loss of left ear 07/18/2019   Bilateral temporomandibular joint pain 07/18/2019   Eustachian tube dysfunction, left 07/17/2019   Hypoxia 02/14/2016   Acute dyspnea 02/12/2016   Dyspnea on exertion 02/12/2016   Osteoarthritis of left knee 01/30/2016   Status post total left knee replacement 01/30/2016    PCP: Gwenn Norris, MD   REFERRING PROVIDER: Poli Lonni CINDERELLA*   REFERRING DIAG: F74.447 (ICD-10-CM) - Pain of left hip   THERAPY DIAG:  Radiculopathy, lumbar region  Muscle  weakness (generalized)  Cramp and spasm  Rationale for Evaluation and Treatment: Rehabilitation  ONSET DATE: 2 weeks ago  SUBJECTIVE:   SUBJECTIVE STATEMENT: It's the exact same thing I had 3 years ago. It hurts all the time, but standing is the most pain. Has to sit getting ready in the morning. Cannot sleep on it. Theragun hurts. It started in my low back. Intermittent back pain like one time per year. I also have plantar faciitis in her left foot about a month ago. It's getting better. Started doing all her exercises from last time when this started. Bending down to squeegie the shower really hurts.   PERTINENT HISTORY: L TKR PAIN:  Are you having pain? Yes: NPRS scale: 7/10  up tot 8-9/10 and down to 5/10 Pain location: L piriformis and hip and about halfway to thigh Pain description: burning and cramping Aggravating factors: standing, lying on it Relieving factors: ibuprofen  PRECAUTIONS: None  RED FLAGS: None   WEIGHT BEARING RESTRICTIONS: No  FALLS:  Has patient fallen in last 6 months? 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  PLOF: Independent  PATIENT GOALS: pain  relief  NEXT MD VISIT: none scheduled  OBJECTIVE:  Note: Objective measures were completed at Evaluation unless otherwise noted.  DIAGNOSTIC FINDINGS: negative hip xr 2022  PATIENT SURVEYS:  LEFS  Extreme difficulty/unable (0), Quite a bit of difficulty (1), Moderate difficulty (2), Little difficulty (3), No difficulty (4) Survey date:  10/17/24  Any of your usual work, housework or school activities 1  2. Usual hobbies, recreational or sporting activities 0  3. Getting into/out of the bath 3  4. Walking between rooms 3  5. Putting on socks/shoes 3  6. Squatting  1  7. Lifting an object,  like a bag of groceries from the floor 1  8. Performing light activities around your home 1  9. Performing heavy activities around your home 0  10. Getting into/out of a car 3  11. Walking 2 blocks 0  12. Walking 1 mile 0  13. Going up/down 10 stairs (1 flight) 2  14. Standing for 1 hour 0  15.  sitting for 1 hour 1  16. Running on even ground 0  17. Running on uneven ground 0  18. Making sharp turns while running fast 0  19. Hopping  0  20. Rolling over in bed 3  Score total:  25/80     COGNITION: Overall cognitive status: Within functional limits for tasks assessed      MUSCLE LENGTH: Mild tightness in hips  POSTURE: rounded shoulders  PALPATION: Palpation: TTP at L QL, L gluteals, piriformis, quads and ITB. Increased tissue tension in gluteals/piriformis Spinal Mobility: pain at R UPA  L5/S1, L UPA painful at L5/S1 and L4/5    LUMBAR ROM: left SB 18, R SB 25, R rot WFL, L rot 75%, Flex hands to above knees, ext 10 deg  Pain in left buttocks with flex, ext, left SB, B rotation  LOWER EXTREMITY MMT:  MMT ROM Right eval Left eval  Hip flexion 5 3+ releases due to pain  Hip extension 4- releases due to L pain 5  Hip abduction  4/5 pain  Hip adduction  4+  Hip internal rotation    Hip external rotation    Knee flexion  5  Knee extension  5  Ankle dorsiflexion  5  Ankle plantarflexion    Ankle inversion    Ankle eversion     (Blank rows = not tested)  LOWER EXTREMITY ROM: WFL for tasks assessed  LUMBAR SPECIAL TESTS:  Straight leg raise test: Positive and Slump test: Positive  Prone over 2 pillows decreases intensity some Hooklying B LE flexion x 10  FUNCTIONAL TESTS:  5 times sit to stand: 12.85 sec  GAIT: Comments: antalgic                                                                                                                                TREATMENT DATE:   10/17/24  See pt ed and HEP   Trigger Point Dry  Needling  Initial Treatment: Pt  instructed on Dry Needling rational, procedures, and possible side effects. Pt instructed to expect mild to moderate muscle soreness later in the day and/or into the next day.  Pt instructed in methods to reduce muscle soreness. Pt instructed to continue prescribed HEP. Patient was educated on signs and symptoms of infection and other risk factors and advised to seek medical attention should they occur.  Patient verbalized understanding of these instructions and education.   Patient Verbal Consent Given: Yes Education Handout Provided: Yes Muscles Treated: L gluteals, piriformis and lateral quads Electrical Stimulation Performed: No Treatment Response/Outcome: Utilized skilled palpation to identify bony landmarks and trigger points.  Able to illicit twitch response and muscle elongation.  Soft tissue mobilization to muscles needled to further promote tissue elongation and decreased pain.       PATIENT EDUCATION:  Education details: PT eval findings, anticipated POC, HEP review, TPDN rational, procedure, outcomes, potential side effects, and recommended post-treatment exercises/activity, fee schedule for TPDN and lack of insurance coverage requiring payment at time of service, and discussion of modification of HEP to avoid any stretches or exercises that are increasing her pain at this time.   Person educated: Patient Education method: Explanation, Demonstration, Tactile cues, Verbal cues, and Handouts Education comprehension: verbalized understanding and returned demonstration  HOME EXERCISE PROGRAM: From previous episode ( pre [facility])  ASSESSMENT:  CLINICAL IMPRESSION: Patient is a 63 y.o. female who was seen today for physical therapy evaluation and treatment for left hip pain.  She reports the pain began with low back pain 2 weeks ago, but this has resolved. She has a h/o intermittent LBP. She has deficits in core and LE strength and pain with PA mobs to L5/S1 left > right. Her pain  is affecting her ability to stand, dress, sleep and perform ADLs. She did not get any significant relief with extension or flexion exercises, although prone over 2 pillow decreased intensity some. She will benefit from skilled PT to address these deficits and those listed below. She had relief 3 years ago with TPDN for the same symptoms, so treatment repeated today. She had immediate decrease in tissue tension and less pain upon standing.   OBJECTIVE IMPAIRMENTS: Abnormal gait, decreased activity tolerance, difficulty walking, decreased ROM, decreased strength, increased muscle spasms, impaired flexibility, postural dysfunction, and pain.   ACTIVITY LIMITATIONS: carrying, lifting, bending, standing, squatting, sleeping, stairs, transfers, bathing, dressing, hygiene/grooming, and locomotion level  PARTICIPATION LIMITATIONS: meal prep, cleaning, laundry, shopping, community activity, occupation, and yard work  PERSONAL FACTORS: Age, Past/current experiences, and 1-2 comorbidities: low back pain, L plantar faccitis  are also affecting patient's functional outcome.   REHAB POTENTIAL: Excellent  CLINICAL DECISION MAKING: Evolving/moderate complexity  EVALUATION COMPLEXITY: Moderate   GOALS: Goals reviewed with patient? Yes  SHORT TERM GOALS: Target date: 11/14/2024   Patient will be independent with initial HEP. Baseline:  Goal status: INITIAL  2.  Decreased pain by 30% with ADLS Baseline:  Goal status: INITIAL  3.  Improved standing and walking tolerance by 30% Baseline:  Goal status: INITIAL   LONG TERM GOALS: Target date: 12/12/2024   Patient will be independent with advanced/ongoing HEP to improve outcomes and carryover.  Baseline:  Goal status: INITIAL  2.  Patient will report decreased pain by 80% with standing and walking Baseline:  Goal status: INITIAL  3.  Patient will demonstrate improved hip strength to 4+/5 to allow for correct body mechanics Baseline:  Goal  status: INITIAL  4.  Improved LEFS to 34 points showing functional improvement. Baseline: 25/80 Goal status: INITIAL      PLAN:  PT FREQUENCY: 2x/week  PT DURATION: 8 weeks  PLANNED INTERVENTIONS: 97110-Therapeutic exercises, 97530- Therapeutic activity, 97112- Neuromuscular re-education, [phone]- Self Care, 02859- Manual therapy, [phone]- Gait training, [phone]- Aquatic Therapy, [phone]- Electrical stimulation (unattended), [phone]- Ultrasound, [id number]- Ionotophoresis 4mg /ml Dexamethasone , 79439 (1-2 muscles), 20561 (3+ muscles)- Dry Needling, Patient/Family education, Balance training, Stair training, Taping, and Joint mobilization  PLAN FOR NEXT SESSION: Assess response to DN and continue as indicated, Review and progress HEP for hip and core strength, possible traction?   Mliss Cummins, PT 10/17/24 10:08 AM

## 2024-10-16 NOTE — Telephone Encounter (Signed)
 Referral sent for It band/hip pain for dry needling

## 2024-10-16 NOTE — Telephone Encounter (Signed)
 Patient called and ask if Patricia [name] can get her a referral for trigger finger dry needling. RA#[phone]

## 2024-10-17 ENCOUNTER — Ambulatory Visit: Attending: Orthopaedic Surgery | Admitting: Physical Therapy

## 2024-10-17 ENCOUNTER — Encounter: Payer: Self-pay | Admitting: Physical Therapy

## 2024-10-17 ENCOUNTER — Other Ambulatory Visit: Payer: Self-pay

## 2024-10-17 DIAGNOSIS — R252 Cramp and spasm: Secondary | ICD-10-CM | POA: Diagnosis not present

## 2024-10-17 DIAGNOSIS — M6281 Muscle weakness (generalized): Secondary | ICD-10-CM | POA: Insufficient documentation

## 2024-10-17 DIAGNOSIS — M5416 Radiculopathy, lumbar region: Secondary | ICD-10-CM | POA: Insufficient documentation

## 2024-10-17 DIAGNOSIS — M25552 Pain in left hip: Secondary | ICD-10-CM | POA: Insufficient documentation

## 2024-10-23 ENCOUNTER — Ambulatory Visit: Admitting: Rehabilitative and Restorative Service Providers"

## 2024-10-23 ENCOUNTER — Encounter: Payer: Self-pay | Admitting: Rehabilitative and Restorative Service Providers"

## 2024-10-23 DIAGNOSIS — M6281 Muscle weakness (generalized): Secondary | ICD-10-CM | POA: Diagnosis not present

## 2024-10-23 DIAGNOSIS — M25552 Pain in left hip: Secondary | ICD-10-CM | POA: Diagnosis not present

## 2024-10-23 DIAGNOSIS — M5416 Radiculopathy, lumbar region: Secondary | ICD-10-CM | POA: Diagnosis not present

## 2024-10-23 DIAGNOSIS — R252 Cramp and spasm: Secondary | ICD-10-CM

## 2024-10-23 NOTE — Therapy (Addendum)
 OUTPATIENT PHYSICAL THERAPY TREATMENT NOTE AND LATE ENTRY DISCHARGE SUMMARY   Patient Name: Patricia [name] MRN: [id number] DOB:08-Aug-1961, 63 y.o., female Today's Date: 10/23/2024  END OF SESSION:  PT End of Session - 10/23/24 1232     Visit Number 2    Date for Recertification  12/12/24    Authorization Type [employer] 10/17/2024 - 12/15/2024    Authorization - Visit Number 2    Authorization - Number of Visits 7    PT Start Time 1230    PT Stop Time 1310    PT Time Calculation (min) 40 min    Activity Tolerance Patient tolerated treatment well    Behavior During Therapy WFL for tasks assessed/performed          Past Medical History:  Diagnosis Date   Anxiety    Arthritis    oa   Complication of anesthesia    does not like needles, has passsed out with needle insertion   Dysplasia of cervix, low grade (CIN 1) 1987   cryo   Hyperlipemia    Loose body in joint    right knee   Past Surgical History:  Procedure Laterality Date   AUGMENTATION MAMMAPLASTY     2009 ruptured silicone implants, replaced with saline 2009   EYE SURGERY Bilateral 01-13-2005   JOINT REPLACEMENT  12-13-2009   toe joint left big toe   KNEE SURGERY Left 12-12-14   lateral release and meniscus repair   LIPOSUCTION     TOTAL KNEE ARTHROPLASTY Left 01/30/2016   Procedure: LEFT TOTAL KNEE ARTHROPLASTY;  Surgeon: Lonni CINDERELLA Poli, MD;  Location: [facility];  Service: Orthopedics;  Laterality: Left;   uterine ablation  07-14-2007   Patient Active Problem List   Diagnosis Date Noted   Asymmetrical hearing loss of left ear 07/18/2019   Bilateral temporomandibular joint pain 07/18/2019   Eustachian tube dysfunction, left 07/17/2019   Hypoxia 02/14/2016   Acute dyspnea 02/12/2016   Dyspnea on exertion 02/12/2016   Osteoarthritis of left knee 01/30/2016   Status post total left knee replacement 01/30/2016    PCP: Gwenn Norris, MD   REFERRING PROVIDER: Poli Lonni CINDERELLA*   REFERRING  DIAG: F74.447 (ICD-10-CM) - Pain of left hip   THERAPY DIAG:  Radiculopathy, lumbar region  Muscle weakness (generalized)  Cramp and spasm  Rationale for Evaluation and Treatment: Rehabilitation  ONSET DATE: 2 weeks ago  SUBJECTIVE:   SUBJECTIVE STATEMENT: Pt reports that the dry needling seemed to help a little, but not like 3 years ago, but wants to try it again.  Patient states that her Advil that she took helps, but it is wearing off some.  PERTINENT HISTORY: L TKR PAIN:  Are you having pain? Yes: NPRS scale: 4/10 Pain location: L piriformis and hip and about halfway to thigh Pain description: burning and cramping Aggravating factors: standing, lying on it Relieving factors: ibuprofen  PRECAUTIONS: None  RED FLAGS: None   WEIGHT BEARING RESTRICTIONS: No  FALLS:  Has patient fallen in last 6 months? 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  PLOF: Independent  PATIENT GOALS: pain relief  NEXT MD VISIT: none scheduled  OBJECTIVE:  Note: Objective measures were completed at Evaluation unless otherwise noted.  DIAGNOSTIC FINDINGS: negative hip xr 2022  PATIENT SURVEYS:  LEFS  Extreme difficulty/unable (0), Quite a bit of difficulty (1), Moderate difficulty (2), Little difficulty (3), No difficulty (4) Survey date:  10/17/24  Any of your usual work, housework or school activities 1  2. Usual hobbies, recreational or sporting activities 0  3. Getting into/out of the bath 3  4. Walking between rooms 3  5. Putting on socks/shoes 3  6. Squatting  1  7. Lifting an object, like a bag of groceries from the floor 1  8. Performing light activities around your home 1  9. Performing heavy activities around your home 0  10. Getting into/out of a car 3  11.  Walking 2 blocks 0  12. Walking 1 mile 0  13. Going up/down 10 stairs (1 flight) 2  14. Standing for 1 hour 0  15.  sitting for 1 hour 1  16. Running on even ground 0  17. Running on uneven ground 0  18. Making sharp turns while running fast 0  19. Hopping  0  20. Rolling over in bed 3  Score total:  25/80     COGNITION: Overall cognitive status: Within functional limits for tasks assessed      MUSCLE LENGTH: Mild tightness in hips  POSTURE: rounded shoulders  PALPATION: Palpation: TTP at L QL, L gluteals, piriformis, quads and ITB. Increased tissue tension in gluteals/piriformis Spinal Mobility: pain at R UPA  L5/S1, L UPA painful at L5/S1 and L4/5    LUMBAR ROM: left SB 18, R SB 25, R rot WFL, L rot 75%, Flex hands to above knees, ext 10 deg  Pain in left buttocks with flex, ext, left SB, B rotation  LOWER EXTREMITY MMT:  MMT ROM Right eval Left eval  Hip flexion 5 3+ releases due to pain  Hip extension 4- releases due to L pain 5  Hip abduction  4/5 pain  Hip adduction  4+  Hip internal rotation    Hip external rotation    Knee flexion  5  Knee extension  5  Ankle dorsiflexion  5  Ankle plantarflexion    Ankle inversion    Ankle eversion     (Blank rows = not tested)  LOWER EXTREMITY ROM: WFL for tasks assessed  LUMBAR SPECIAL TESTS:  Straight leg raise test: Positive and Slump test: Positive  Prone over 2 pillows decreases intensity some Hooklying B LE flexion x 10  FUNCTIONAL TESTS:  5 times sit to stand: 12.85 sec  GAIT: Comments: antalgic                                                                                                                                TREATMENT DATE:    10/23/2024: Nustep level 5 x6 min with PT present to discuss status Standing hamstring stretch at stairs 2x20 sec bilat Standing hip flexor stretch at stairs with lateral overhead reach x10 Seated piriformis stretch 2x20 sec bilat Standing rocker board for  DF/PF x2  min Standing hip hike from 2 step x10 bilat Trigger Point Dry Needling Subsequent Treatment: Instructions provided previously at initial dry needling treatment.  Patient Verbal Consent Given: Yes Education Handout Provided: Previously Provided Muscles Treated: Lumbar multifidi, glutes, piriformis, quadratus laborum Electrical Stimulation Performed: No Treatment Response/Outcome: Utilized skilled palpation to identify bony landmarks and trigger points.  Able to illicit twitch response and muscle elongation.  Soft tissue mobilization following to further promote tissue elongation.  Manual Therapy:  soft tissue mobilization to bilateral lumbar paraspinals to promote further tissue elongation and decreased pain      10/17/24  See pt ed and HEP   Trigger Point Dry Needling  Initial Treatment: Pt instructed on Dry Needling rational, procedures, and possible side effects. Pt instructed to expect mild to moderate muscle soreness later in the day and/or into the next day.  Pt instructed in methods to reduce muscle soreness. Pt instructed to continue prescribed HEP. Patient was educated on signs and symptoms of infection and other risk factors and advised to seek medical attention should they occur.  Patient verbalized understanding of these instructions and education.   Patient Verbal Consent Given: Yes Education Handout Provided: Yes Muscles Treated: L gluteals, piriformis and lateral quads Electrical Stimulation Performed: No Treatment Response/Outcome: Utilized skilled palpation to identify bony landmarks and trigger points.  Able to illicit twitch response and muscle elongation.  Soft tissue mobilization to muscles needled to further promote tissue elongation and decreased pain.       PATIENT EDUCATION:  Education details: PT eval findings, anticipated POC, HEP review, TPDN rational, procedure, outcomes, potential side effects, and recommended post-treatment exercises/activity,  fee schedule for TPDN and lack of insurance coverage requiring payment at time of service, and discussion of modification of HEP to avoid any stretches or exercises that are increasing her pain at this time.   Person educated: Patient Education method: Explanation, Demonstration, Tactile cues, Verbal cues, and Handouts Education comprehension: verbalized understanding and returned demonstration  HOME EXERCISE PROGRAM: Access Code: [email or web address] URL: [email or web address] Date: 10/23/2024 Prepared by: Jarrell Amandeep Hogston  Exercises - Supine Lower Trunk Rotation  - 1-2 x daily - 7 x weekly - 1 sets - 5 reps - 15 sec hold - Supine Double Knee to Chest Modified  - 1-2 x daily - 7 x weekly - 1 sets - 3 reps - 20 sec hold - Supine Hamstring Stretch  - 1-2 x daily - 7 x weekly - 1 sets - 3 reps - 20 sec hold - Supine Piriformis Stretch with Foot on Ground  - 1-2 x daily - 7 x weekly - 1 sets - 3 reps - 20 sec hold - Seated Quadratus Lumborum Stretch with Arm Overhead  - 1-2 x daily - 7 x weekly - 1 sets - 3 reps - 20 sec hold - Standing ITB Stretch  - 1-2 x daily - 7 x weekly - 1 sets - 3 reps - 20 sec hold - Sidelying IT Band Foam Roll Mobilization  - 1-2 x daily - 7 x weekly - 1 sets - 10 reps - Clamshell with Resistance  - 1-2 x daily - 7 x weekly - 2 sets - 10 reps - Side Stepping with Resistance at Ankles  - 1-2 x daily - 7 x weekly - 2 sets - 10 reps - Standing Hip Flexion with Resistance Loop  - 1 x daily - 7 x weekly - 2 sets - 10 reps - Hip Abduction with Resistance Loop  - 1 x  daily - 7 x weekly - 2 sets - 10 reps - Hip Extension with Resistance Loop  - 1 x daily - 7 x weekly - 2 sets - 10 reps  ASSESSMENT:  CLINICAL IMPRESSION: Lynise presents to skilled PT stating that she has been doing her exercises.  She states that her green theraband that she had from 3 years ago, so provided patient with a theraband.  Patient with great participation throughout session and progressed with  flexibility and some cuing for improved posture.  Patient with great twitch responses noted with bilateral sides for dry needling.  Followed with manual therapy to allow for decreased pain.  Educated patient about proper hydration.  Patient continues to require skilled PT to progress towards goal related activities.  OBJECTIVE IMPAIRMENTS: Abnormal gait, decreased activity tolerance, difficulty walking, decreased ROM, decreased strength, increased muscle spasms, impaired flexibility, postural dysfunction, and pain.   ACTIVITY LIMITATIONS: carrying, lifting, bending, standing, squatting, sleeping, stairs, transfers, bathing, dressing, hygiene/grooming, and locomotion level  PARTICIPATION LIMITATIONS: meal prep, cleaning, laundry, shopping, community activity, occupation, and yard work  PERSONAL FACTORS: Age, Past/current experiences, and 1-2 comorbidities: low back pain, L plantar faccitis  are also affecting patient's functional outcome.   REHAB POTENTIAL: Excellent  CLINICAL DECISION MAKING: Evolving/moderate complexity  EVALUATION COMPLEXITY: Moderate   GOALS: Goals reviewed with patient? Yes  SHORT TERM GOALS: Target date: 11/14/2024   Patient will be independent with initial HEP. Baseline:  Goal status: Ongoing  2.  Decreased pain by 30% with ADLS Baseline:  Goal status: INITIAL  3.  Improved standing and walking tolerance by 30% Baseline:  Goal status: INITIAL   LONG TERM GOALS: Target date: 12/12/2024   Patient will be independent with advanced/ongoing HEP to improve outcomes and carryover.  Baseline:  Goal status: INITIAL  2.  Patient will report decreased pain by 80% with standing and walking Baseline:  Goal status: INITIAL  3.  Patient will demonstrate improved hip strength to 4+/5 to allow for correct body mechanics Baseline:  Goal status: INITIAL  4.   Improved LEFS to 34 points showing functional improvement. Baseline: 25/80 Goal status:  INITIAL      PLAN:  PT FREQUENCY: 2x/week  PT DURATION: 8 weeks  PLANNED INTERVENTIONS: 97110-Therapeutic exercises, 97530- Therapeutic activity, 97112- Neuromuscular re-education, [phone]- Self Care, 02859- Manual therapy, [phone]- Gait training, [phone]- Aquatic Therapy, [phone]- Electrical stimulation (unattended), 97035- Ultrasound, [id number]- Ionotophoresis 4mg /ml Dexamethasone , 79439 (1-2 muscles), [id number] (3+ muscles)- Dry Needling, Patient/Family education, Balance training, Stair training, Taping, and Joint mobilization  PLAN FOR NEXT SESSION: Assess response to DN and continue as indicated, Review and progress HEP for hip and core strength, possible traction?   Jarrell Laming, PT, DPT 10/23/24, 1:30 PM  [facility] [address], [address] [place], [place] [address] Phone # [phone] Fax [phone]    PHYSICAL THERAPY DISCHARGE SUMMARY  As of 11/05/24, patient has not returned for further visits and messaged the clinic stating that she was feeling much better and has met all goals and no longer requires PT.  Please see above for most recent PT update.  Patient agrees to discharge. Patient goals were met per patient report. Patient is being discharged due patient request.  Jarrell Laming, PT, DPT 11/05/24, 7:56 AM

## 2024-10-28 NOTE — Therapy (Incomplete)
 OUTPATIENT PHYSICAL THERAPY TREATMENT NOTE   Patient Name: Patricia [name] MRN: [id number] DOB:Jan 07, 1961, 63 y.o., female Today's Date: 10/28/2024  END OF SESSION:    Past Medical History:  Diagnosis Date   Anxiety    Arthritis    oa   Complication of anesthesia    does not like needles, has passsed out with needle insertion   Dysplasia of cervix, low grade (CIN 1) 1987   cryo   Hyperlipemia    Loose body in joint    right knee   Past Surgical History:  Procedure Laterality Date   AUGMENTATION MAMMAPLASTY     2009 ruptured silicone implants, replaced with saline 2009   EYE SURGERY Bilateral 01-13-2005   JOINT REPLACEMENT  12-13-2009   toe joint left big toe   KNEE SURGERY Left 12-12-14   lateral release and meniscus repair   LIPOSUCTION     TOTAL KNEE ARTHROPLASTY Left 01/30/2016   Procedure: LEFT TOTAL KNEE ARTHROPLASTY;  Surgeon: Lonni CINDERELLA Poli, MD;  Location: [facility];  Service: Orthopedics;  Laterality: Left;   uterine ablation  07-14-2007   Patient Active Problem List   Diagnosis Date Noted   Asymmetrical hearing loss of left ear 07/18/2019   Bilateral temporomandibular joint pain 07/18/2019   Eustachian tube dysfunction, left 07/17/2019   Hypoxia 02/14/2016   Acute dyspnea 02/12/2016   Dyspnea on exertion 02/12/2016   Osteoarthritis of left knee 01/30/2016   Status post total left knee replacement 01/30/2016    PCP: Gwenn Norris, MD   REFERRING PROVIDER: Poli Lonni CINDERELLA*   REFERRING DIAG: F74.447 (ICD-10-CM) - Pain of left hip   THERAPY DIAG:  No diagnosis found.  Rationale for Evaluation and Treatment: Rehabilitation  ONSET DATE: 2 weeks ago  SUBJECTIVE:   SUBJECTIVE STATEMENT: ***  PERTINENT HISTORY: L TKR PAIN:  Are you having pain? Yes: NPRS scale: 4/10 Pain location: L piriformis and hip and about halfway to thigh Pain description: burning and cramping Aggravating factors: standing, lying on it Relieving  factors: ibuprofen  PRECAUTIONS: None  RED FLAGS: None   WEIGHT BEARING RESTRICTIONS: No  FALLS:  Has patient fallen in last 6 months? 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  PLOF: Independent  PATIENT GOALS: pain relief  NEXT MD VISIT: none scheduled  OBJECTIVE:  Note: Objective measures were completed at Evaluation unless otherwise noted.  DIAGNOSTIC FINDINGS: negative hip xr 2022  PATIENT SURVEYS:  LEFS  Extreme difficulty/unable (0), Quite a bit of difficulty (1), Moderate difficulty (2), Little difficulty (3), No difficulty (4) Survey date:  10/17/24  Any of your usual work, housework or school activities 1  2. Usual hobbies, recreational or sporting activities 0  3. Getting into/out of the bath 3  4. Walking between rooms 3  5. Putting on socks/shoes 3  6. Squatting  1  7. Lifting an object, like a bag of groceries from the floor 1  8. Performing light activities around your home 1  9. Performing heavy activities around your home 0  10. Getting into/out of a car 3  11. Walking 2 blocks 0  12. Walking 1 mile 0  13. Going up/down 10 stairs (1 flight) 2  14. Standing for 1 hour 0  15.  sitting for 1 hour 1  16. Running on  even ground 0  17. Running on uneven ground 0  18. Making sharp turns while running fast 0  19. Hopping  0  20. Rolling over in bed 3  Score total:  25/80     COGNITION: Overall cognitive status: Within functional limits for tasks assessed      MUSCLE LENGTH: Mild tightness in hips  POSTURE: rounded shoulders  PALPATION: Palpation: TTP at L QL, L gluteals, piriformis, quads and ITB. Increased tissue tension in gluteals/piriformis Spinal Mobility: pain at R UPA  L5/S1, L UPA painful at L5/S1 and L4/5    LUMBAR ROM: left SB  18, R SB 25, R rot WFL, L rot 75%, Flex hands to above knees, ext 10 deg  Pain in left buttocks with flex, ext, left SB, B rotation  LOWER EXTREMITY MMT:  MMT ROM Right eval Left eval  Hip flexion 5 3+ releases due to pain  Hip extension 4- releases due to L pain 5  Hip abduction  4/5 pain  Hip adduction  4+  Hip internal rotation    Hip external rotation    Knee flexion  5  Knee extension  5  Ankle dorsiflexion  5  Ankle plantarflexion    Ankle inversion    Ankle eversion     (Blank rows = not tested)  LOWER EXTREMITY ROM: WFL for tasks assessed  LUMBAR SPECIAL TESTS:  Straight leg raise test: Positive and Slump test: Positive  Prone over 2 pillows decreases intensity some Hooklying B LE flexion x 10  FUNCTIONAL TESTS:  5 times sit to stand: 12.85 sec  GAIT: Comments: antalgic                                                                                                                                TREATMENT DATE:   10/29/2024: Nustep level 5 x6 min with PT present to discuss status ***DN? Standing hamstring stretch at stairs 2x20 sec bilat Standing hip flexor stretch at stairs with lateral overhead reach x10 Seated piriformis stretch 2x20 sec bilat Standing rocker board for DF/PF x2 min Standing hip hike from 2 step x10 bilat See HEP     10/23/2024: Nustep level 5 x6 min with PT present to discuss status Standing hamstring stretch at stairs 2x20 sec bilat Standing hip flexor stretch at stairs with lateral overhead reach x10 Seated piriformis stretch 2x20 sec bilat Standing rocker board for DF/PF x2 min Standing hip hike from 2 step x10 bilat Trigger Point Dry Needling Subsequent Treatment: Instructions provided previously at initial dry needling treatment.  Patient Verbal Consent Given: Yes Education Handout Provided: Previously Provided Muscles Treated: Lumbar multifidi, glutes, piriformis, quadratus laborum Electrical Stimulation Performed:  No Treatment Response/Outcome: Utilized skilled palpation to identify bony landmarks and trigger points.  Able to illicit twitch response and muscle elongation.  Soft tissue mobilization following to further promote tissue elongation.  Manual Therapy:  soft tissue mobilization to bilateral lumbar paraspinals to  promote further tissue elongation and decreased pain      10/17/24  See pt ed and HEP   Trigger Point Dry Needling  Initial Treatment: Pt instructed on Dry Needling rational, procedures, and possible side effects. Pt instructed to expect mild to moderate muscle soreness later in the day and/or into the next day.  Pt instructed in methods to reduce muscle soreness. Pt instructed to continue prescribed HEP. Patient was educated on signs and symptoms of infection and other risk factors and advised to seek medical attention should they occur.  Patient verbalized understanding of these instructions and education.   Patient Verbal Consent Given: Yes Education Handout Provided: Yes Muscles Treated: L gluteals, piriformis and lateral quads Electrical Stimulation Performed: No Treatment Response/Outcome: Utilized skilled palpation to identify bony landmarks and trigger points.  Able to illicit twitch response and muscle elongation.  Soft tissue mobilization to muscles needled to further promote tissue elongation and decreased pain.       PATIENT EDUCATION:  Education details: PT eval findings, anticipated POC, HEP review, TPDN rational, procedure, outcomes, potential side effects, and recommended post-treatment exercises/activity, fee schedule for TPDN and lack of insurance coverage requiring payment at time of service, and discussion of modification of HEP to avoid any stretches or exercises that are increasing her pain at this time.   Person educated: Patient Education method: Explanation, Demonstration, Tactile cues, Verbal cues, and Handouts Education comprehension: verbalized  understanding and returned demonstration  HOME EXERCISE PROGRAM: Access Code: [email or web address] URL: [email or web address] Date: 10/23/2024 Prepared by: Jarrell Menke  Exercises - Supine Lower Trunk Rotation  - 1-2 x daily - 7 x weekly - 1 sets - 5 reps - 15 sec hold - Supine Double Knee to Chest Modified  - 1-2 x daily - 7 x weekly - 1 sets - 3 reps - 20 sec hold - Supine Hamstring Stretch  - 1-2 x daily - 7 x weekly - 1 sets - 3 reps - 20 sec hold - Supine Piriformis Stretch with Foot on Ground  - 1-2 x daily - 7 x weekly - 1 sets - 3 reps - 20 sec hold - Seated Quadratus Lumborum Stretch with Arm Overhead  - 1-2 x daily - 7 x weekly - 1 sets - 3 reps - 20 sec hold - Standing ITB Stretch  - 1-2 x daily - 7 x weekly - 1 sets - 3 reps - 20 sec hold - Sidelying IT Band Foam Roll Mobilization  - 1-2 x daily - 7 x weekly - 1 sets - 10 reps - Clamshell with Resistance  - 1-2 x daily - 7 x weekly - 2 sets - 10 reps - Side Stepping with Resistance at Ankles  - 1-2 x daily - 7 x weekly - 2 sets - 10 reps - Standing Hip Flexion with Resistance Loop  - 1 x daily - 7 x weekly - 2 sets - 10 reps - Hip Abduction with Resistance Loop  - 1 x daily - 7 x weekly - 2 sets - 10 reps - Hip Extension with Resistance Loop  - 1 x daily - 7 x weekly - 2 sets - 10 reps  ASSESSMENT:  CLINICAL IMPRESSION: ***  OBJECTIVE IMPAIRMENTS: Abnormal gait, decreased activity tolerance, difficulty walking, decreased ROM, decreased strength, increased muscle spasms, impaired flexibility, postural dysfunction, and pain.   ACTIVITY LIMITATIONS: carrying, lifting, bending, standing, squatting, sleeping, stairs, transfers, bathing, dressing, hygiene/grooming, and locomotion level  PARTICIPATION LIMITATIONS: meal prep, cleaning, laundry, shopping, community activity,  occupation, and yard work  PERSONAL FACTORS: Age, Past/current experiences, and 1-2 comorbidities: low back pain, L plantar faccitis  are also affecting  patient's functional outcome.   REHAB POTENTIAL: Excellent  CLINICAL DECISION MAKING: Evolving/moderate complexity  EVALUATION COMPLEXITY: Moderate   GOALS: Goals reviewed with patient? Yes  SHORT TERM GOALS: Target date: 11/14/2024   Patient will be independent with initial HEP. Baseline:  Goal status: Ongoing  2.  Decreased pain by 30% with ADLS Baseline:  Goal status: INITIAL  3.  Improved standing and walking tolerance by 30% Baseline:  Goal status: INITIAL   LONG TERM GOALS: Target date: 12/12/2024   Patient will be independent with advanced/ongoing HEP to improve outcomes and carryover.  Baseline:  Goal status: INITIAL  2.  Patient will report decreased pain by 80% with standing and walking Baseline:  Goal status: INITIAL  3.  Patient will demonstrate improved hip strength to 4+/5 to allow for correct body mechanics Baseline:  Goal status: INITIAL  4.   Improved LEFS to 34 points showing functional improvement. Baseline: 25/80 Goal status: INITIAL      PLAN:  PT FREQUENCY: 2x/week  PT DURATION: 8 weeks  PLANNED INTERVENTIONS: 97110-Therapeutic exercises, 97530- Therapeutic activity, 97112- Neuromuscular re-education, [phone]- Self Care, 02859- Manual therapy, [phone]- Gait training, [phone]- Aquatic Therapy, [phone]- Electrical stimulation (unattended), [phone]- Ultrasound, [id number]- Ionotophoresis 4mg /ml Dexamethasone , 79439 (1-2 muscles), [id number] (3+ muscles)- Dry Needling, Patient/Family education, Balance training, Stair training, Taping, and Joint mobilization  PLAN FOR NEXT SESSION: Assess response to DN and continue as indicated, Review and progress HEP for hip and core strength, possible traction?   Mliss Cummins, PT 10/28/24, 8:08 PM  [facility] [address] [place], [place] [address] Phone # [phone] Fax [phone]

## 2024-10-29 ENCOUNTER — Ambulatory Visit: Admitting: Physical Therapy

## 2024-11-06 ENCOUNTER — Ambulatory Visit: Admitting: Physical Therapy

## 2024-11-13 ENCOUNTER — Ambulatory Visit: Admitting: Physical Therapy

## 2024-11-20 ENCOUNTER — Ambulatory Visit: Admitting: Physical Therapy

## 2024-11-27 ENCOUNTER — Ambulatory Visit: Admitting: Physical Therapy

## 2024-12-03 ENCOUNTER — Ambulatory Visit: Admitting: Physical Therapy

## 2024-12-10 ENCOUNTER — Ambulatory Visit: Admitting: Rehabilitative and Restorative Service Providers"

## 2025-01-02 ENCOUNTER — Ambulatory Visit

## 2025-01-02 ENCOUNTER — Encounter: Payer: Self-pay | Admitting: Podiatry

## 2025-01-02 ENCOUNTER — Ambulatory Visit (INDEPENDENT_AMBULATORY_CARE_PROVIDER_SITE_OTHER): Admitting: Podiatry

## 2025-01-02 DIAGNOSIS — M722 Plantar fascial fibromatosis: Secondary | ICD-10-CM | POA: Diagnosis not present

## 2025-01-02 MED ORDER — DEXAMETHASONE SODIUM PHOSPHATE 120 MG/30ML IJ SOLN
4.0000 mg | Freq: Once | INTRAMUSCULAR | Status: AC
  Administered 2025-01-02: 4 mg via INTRA_ARTICULAR

## 2025-01-02 MED ORDER — MELOXICAM 15 MG PO TABS: 15.0000 mg | ORAL_TABLET | Freq: Every day | ORAL | 0 refills | Status: AC

## 2025-01-02 MED ORDER — TRIAMCINOLONE ACETONIDE 10 MG/ML IJ SUSP
2.5000 mg | Freq: Once | INTRAMUSCULAR | Status: AC
  Administered 2025-01-02: 2.5 mg via INTRA_ARTICULAR

## 2025-01-02 NOTE — Progress Notes (Signed)
"  °  Subjective:  Patient ID: Patricia [name], female    DOB: 1961/07/01,   MRN: [id number]  Chief Complaint  Patient presents with   Foot Pain    I have pain on my left heel on the bottom and I have pain around my ankle.    64 y.o. female presents for concern of left heel pain that has been ongoing since around August.  She relates it has progressively been worsening recently.  She relates for steps in the morning are particularly painful as well as at the end of the day and even after being off her feet she is still getting pain in the heel.  She relates it has been very inflamed.  She has tried stretching and wearing good supportive shoes like new balance and oofos.  She has been taking ibuprofen which helps some.. Denies any other pedal complaints. Denies n/v/f/c.   Past Medical History:  Diagnosis Date   Anxiety    Arthritis    oa   Complication of anesthesia    does not like needles, has passsed out with needle insertion   Dysplasia of cervix, low grade (CIN 1) 1987   cryo   Hyperlipemia    Loose body in joint    right knee    Objective:  Physical Exam: Vascular: DP/PT pulses 2/4 bilateral. CFT <3 seconds. Normal hair growth on digits. No edema.  Skin. No lacerations or abrasions bilateral feet.  Musculoskeletal: MMT 5/5 bilateral lower extremities in DF, PF, Inversion and Eversion. Deceased ROM in DF of ankle joint. Tender to the medial calcaneal tubercle left . No pain with achilles, PT or arch. No pain with calcaneal squeeze.  Some pain noted along PT tendon as well as peroneal tendon on left as well.  No pain with manual muscle testing. Neurological: Sensation intact to light touch.   Assessment:   1. Plantar fasciitis, left      Plan:  Patient was evaluated and treated and all questions answered. Discussed plantar fasciitis with patient.  X-rays reviewed and discussed with patient. No acute fractures or dislocations noted. Mild spurring noted at inferior  calcaneus.  Discussed treatment options including, ice, NSAIDS, supportive shoes, bracing, and stretching. Stretching exercises provided to be done on a daily basis.   Prescription for meloxicam  provided and sent to pharmacy. Patient requesting injection today. Procedure note below. Plantar fascia brace dispensed. Follow-up 6 weeks or sooner if any problems arise. In the meantime, encouraged to call the office with any questions, concerns, change in symptoms.   Procedure:  Discussed etiology, pathology, conservative vs. surgical therapies. At this time a plantar fascial injection was recommended.  The patient agreed and a sterile skin prep was applied.  An injection consisting of  1cc dexamethasone  0.5 cc kenalog  and 1cc marcaine  mixture was infiltrated at the point of maximal tenderness on the left Heel.  Bandaid applied. The patient tolerated this well and was given instructions for aftercare.    Patricia [name], DPM    "

## 2025-01-03 ENCOUNTER — Telehealth: Payer: Self-pay | Admitting: Lab

## 2025-01-03 NOTE — Telephone Encounter (Signed)
 Patient states never was given exercises and they are not listed in my chart please advise.

## 2025-01-03 NOTE — Patient Instructions (Signed)

## 2025-01-03 NOTE — Telephone Encounter (Signed)
 Patient informed exercises are in chart now.

## 2025-02-13 ENCOUNTER — Ambulatory Visit: Admitting: Podiatry
